# Patient Record
Sex: Male | Born: 1965 | Race: White | Hispanic: No | Marital: Married | Smoking: Never smoker
Health system: Southern US, Community
[De-identification: ages and names within clinical notes are randomized; demographics above are authoritative.]

## PROBLEM LIST (undated history)

## (undated) DIAGNOSIS — I10 Essential (primary) hypertension: Secondary | ICD-10-CM

## (undated) DIAGNOSIS — T4145XA Adverse effect of unspecified anesthetic, initial encounter: Secondary | ICD-10-CM

## (undated) DIAGNOSIS — K2270 Barrett's esophagus without dysplasia: Secondary | ICD-10-CM

## (undated) DIAGNOSIS — Z8719 Personal history of other diseases of the digestive system: Secondary | ICD-10-CM

## (undated) DIAGNOSIS — K8020 Calculus of gallbladder without cholecystitis without obstruction: Secondary | ICD-10-CM

## (undated) DIAGNOSIS — Z87442 Personal history of urinary calculi: Secondary | ICD-10-CM

## (undated) DIAGNOSIS — T8859XA Other complications of anesthesia, initial encounter: Secondary | ICD-10-CM

## (undated) DIAGNOSIS — R001 Bradycardia, unspecified: Secondary | ICD-10-CM

## (undated) DIAGNOSIS — J45909 Unspecified asthma, uncomplicated: Secondary | ICD-10-CM

## (undated) DIAGNOSIS — K3184 Gastroparesis: Secondary | ICD-10-CM

## (undated) DIAGNOSIS — K219 Gastro-esophageal reflux disease without esophagitis: Secondary | ICD-10-CM

## (undated) HISTORY — PX: KNEE ARTHROSCOPY: SUR90

## (undated) HISTORY — PX: INGUINAL HERNIA REPAIR: SHX194

---

## 2003-05-22 HISTORY — PX: COLONOSCOPY WITH ESOPHAGOGASTRODUODENOSCOPY (EGD): SHX5779

## 2004-04-12 ENCOUNTER — Ambulatory Visit: Payer: Self-pay | Admitting: Unknown Physician Specialty

## 2006-12-20 ENCOUNTER — Ambulatory Visit: Payer: Self-pay | Admitting: Unknown Physician Specialty

## 2008-12-27 ENCOUNTER — Ambulatory Visit: Payer: Self-pay | Admitting: Unknown Physician Specialty

## 2011-03-14 ENCOUNTER — Ambulatory Visit: Payer: Self-pay | Admitting: Unknown Physician Specialty

## 2011-12-05 ENCOUNTER — Ambulatory Visit: Payer: Self-pay | Admitting: Nurse Practitioner

## 2011-12-07 ENCOUNTER — Ambulatory Visit: Payer: Self-pay | Admitting: Unknown Physician Specialty

## 2012-02-25 ENCOUNTER — Ambulatory Visit: Payer: Self-pay | Admitting: Urology

## 2012-09-03 ENCOUNTER — Emergency Department: Payer: Self-pay | Admitting: Emergency Medicine

## 2012-09-03 LAB — CBC WITH DIFFERENTIAL/PLATELET
Basophil #: 0 10*3/uL (ref 0.0–0.1)
Basophil %: 0.9 %
Eosinophil %: 5.6 %
HCT: 46.1 % (ref 40.0–52.0)
Lymphocyte %: 33.2 %
MCH: 29.8 pg (ref 26.0–34.0)
MCV: 88 fL (ref 80–100)
Monocyte #: 0.9 x10 3/mm (ref 0.2–1.0)
Monocyte %: 17.8 %
Neutrophil %: 42.5 %
RBC: 5.24 10*6/uL (ref 4.40–5.90)
WBC: 4.9 10*3/uL (ref 3.8–10.6)

## 2012-09-03 LAB — COMPREHENSIVE METABOLIC PANEL
Albumin: 4.1 g/dL (ref 3.4–5.0)
Alkaline Phosphatase: 126 U/L (ref 50–136)
Bilirubin,Total: 0.5 mg/dL (ref 0.2–1.0)
Calcium, Total: 8.9 mg/dL (ref 8.5–10.1)
Chloride: 104 mmol/L (ref 98–107)
Co2: 26 mmol/L (ref 21–32)
EGFR (African American): >60
EGFR (Non-African Amer.): >60
Glucose: 93 mg/dL (ref 65–99)
Osmolality: 268 (ref 275–301)
SGPT (ALT): 45 U/L (ref 12–78)
Sodium: 135 mmol/L — ABNORMAL LOW (ref 136–145)
Total Protein: 8.3 g/dL — ABNORMAL HIGH (ref 6.4–8.2)

## 2014-03-04 DIAGNOSIS — K2270 Barrett's esophagus without dysplasia: Secondary | ICD-10-CM | POA: Insufficient documentation

## 2015-05-22 DIAGNOSIS — K2270 Barrett's esophagus without dysplasia: Secondary | ICD-10-CM

## 2015-05-22 HISTORY — PX: NEUROPLASTY / TRANSPOSITION ULNAR NERVE AT ELBOW: SUR895

## 2015-05-22 HISTORY — DX: Barrett's esophagus without dysplasia: K22.70

## 2017-05-21 DIAGNOSIS — J45909 Unspecified asthma, uncomplicated: Secondary | ICD-10-CM

## 2017-05-21 DIAGNOSIS — K8020 Calculus of gallbladder without cholecystitis without obstruction: Secondary | ICD-10-CM

## 2017-05-21 HISTORY — DX: Unspecified asthma, uncomplicated: J45.909

## 2017-05-21 HISTORY — DX: Calculus of gallbladder without cholecystitis without obstruction: K80.20

## 2017-06-03 ENCOUNTER — Other Ambulatory Visit: Payer: Self-pay

## 2017-06-03 ENCOUNTER — Emergency Department: Payer: BLUE CROSS/BLUE SHIELD

## 2017-06-03 ENCOUNTER — Encounter: Payer: Self-pay | Admitting: Emergency Medicine

## 2017-06-03 ENCOUNTER — Emergency Department
Admission: EM | Admit: 2017-06-03 | Discharge: 2017-06-03 | Disposition: A | Discharge status: Home / Self Care | Payer: BLUE CROSS/BLUE SHIELD | Attending: Emergency Medicine | Admitting: Emergency Medicine

## 2017-06-03 DIAGNOSIS — J45909 Unspecified asthma, uncomplicated: Secondary | ICD-10-CM | POA: Diagnosis not present

## 2017-06-03 DIAGNOSIS — I10 Essential (primary) hypertension: Secondary | ICD-10-CM | POA: Insufficient documentation

## 2017-06-03 DIAGNOSIS — Z79899 Other long term (current) drug therapy: Secondary | ICD-10-CM | POA: Insufficient documentation

## 2017-06-03 DIAGNOSIS — R109 Unspecified abdominal pain: Secondary | ICD-10-CM

## 2017-06-03 DIAGNOSIS — K8020 Calculus of gallbladder without cholecystitis without obstruction: Secondary | ICD-10-CM | POA: Diagnosis not present

## 2017-06-03 DIAGNOSIS — K8021 Calculus of gallbladder without cholecystitis with obstruction: Secondary | ICD-10-CM | POA: Diagnosis not present

## 2017-06-03 DIAGNOSIS — R1032 Left lower quadrant pain: Secondary | ICD-10-CM | POA: Diagnosis present

## 2017-06-03 HISTORY — DX: Gastro-esophageal reflux disease without esophagitis: K21.9

## 2017-06-03 HISTORY — DX: Bradycardia, unspecified: R00.1

## 2017-06-03 HISTORY — DX: Essential (primary) hypertension: I10

## 2017-06-03 HISTORY — DX: Gastroparesis: K31.84

## 2017-06-03 HISTORY — DX: Unspecified asthma, uncomplicated: J45.909

## 2017-06-03 LAB — URINALYSIS, COMPLETE (UACMP) WITH MICROSCOPIC
BACTERIA UA: NONE SEEN
BILIRUBIN URINE: NEGATIVE
Glucose, UA: NEGATIVE mg/dL
Hgb urine dipstick: NEGATIVE
KETONES UR: NEGATIVE mg/dL
LEUKOCYTES UA: NEGATIVE
Nitrite: NEGATIVE
PROTEIN: NEGATIVE mg/dL
SQUAMOUS EPITHELIAL / LPF: NONE SEEN
Specific Gravity, Urine: 1.026 (ref 1.005–1.030)
WBC, UA: NONE SEEN WBC/hpf (ref 0–5)
pH: 6 (ref 5.0–8.0)

## 2017-06-03 LAB — CBC WITH DIFFERENTIAL/PLATELET
BASOS PCT: 1 %
Basophils Absolute: 0.1 10*3/uL (ref 0–0.1)
EOS ABS: 0.1 10*3/uL (ref 0–0.7)
Eosinophils Relative: 1 %
HCT: 42.9 % (ref 40.0–52.0)
HEMOGLOBIN: 14.6 g/dL (ref 13.0–18.0)
Lymphocytes Relative: 15 %
Lymphs Abs: 1.5 10*3/uL (ref 1.0–3.6)
MCH: 29.9 pg (ref 26.0–34.0)
MCHC: 33.9 g/dL (ref 32.0–36.0)
MCV: 88.1 fL (ref 80.0–100.0)
MONOS PCT: 7 %
Monocytes Absolute: 0.7 10*3/uL (ref 0.2–1.0)
NEUTROS PCT: 76 %
Neutro Abs: 7.6 10*3/uL — ABNORMAL HIGH (ref 1.4–6.5)
Platelets: 228 10*3/uL (ref 150–440)
RBC: 4.87 MIL/uL (ref 4.40–5.90)
RDW: 13.6 % (ref 11.5–14.5)
WBC: 10 10*3/uL (ref 3.8–10.6)

## 2017-06-03 LAB — LIPASE, BLOOD: LIPASE: 33 U/L (ref 11–51)

## 2017-06-03 LAB — COMPREHENSIVE METABOLIC PANEL
ALK PHOS: 92 U/L (ref 38–126)
ALT: 45 U/L (ref 17–63)
ANION GAP: 10 (ref 5–15)
AST: 29 U/L (ref 15–41)
Albumin: 4.4 g/dL (ref 3.5–5.0)
BUN: 10 mg/dL (ref 6–20)
CALCIUM: 9.2 mg/dL (ref 8.9–10.3)
CO2: 26 mmol/L (ref 22–32)
Chloride: 103 mmol/L (ref 101–111)
Creatinine, Ser: 1.1 mg/dL (ref 0.61–1.24)
GFR calc Af Amer: >60 mL/min (ref >60)
GFR calc non Af Amer: >60 mL/min (ref >60)
GLUCOSE: 131 mg/dL — AB (ref 65–99)
POTASSIUM: 4 mmol/L (ref 3.5–5.1)
Sodium: 139 mmol/L (ref 135–145)
Total Bilirubin: 1.1 mg/dL (ref 0.3–1.2)
Total Protein: 7.6 g/dL (ref 6.5–8.1)

## 2017-06-03 MED ORDER — ONDANSETRON HCL 4 MG/2ML IJ SOLN: 4.0000 mg | Freq: Once | INTRAMUSCULAR | Status: DC

## 2017-06-03 MED ORDER — HYDROMORPHONE HCL 1 MG/ML IJ SOLN
INTRAMUSCULAR | Status: AC
  Filled 2017-06-03: qty 1

## 2017-06-03 MED ORDER — SODIUM CHLORIDE 0.9 % IV BOLUS (SEPSIS)
1000.0000 mL | Freq: Once | INTRAVENOUS | Status: AC
  Administered 2017-06-03: 1000 mL via INTRAVENOUS

## 2017-06-03 MED ORDER — ONDANSETRON HCL 4 MG/2ML IJ SOLN
INTRAMUSCULAR | Status: AC
  Filled 2017-06-03: qty 2

## 2017-06-03 MED ORDER — HYDROMORPHONE HCL 1 MG/ML IJ SOLN: 1.0000 mg | Freq: Once | INTRAMUSCULAR | Status: DC

## 2017-06-03 MED ORDER — IOPAMIDOL (ISOVUE-370) INJECTION 76%
100.0000 mL | Freq: Once | INTRAVENOUS | Status: AC | PRN
  Administered 2017-06-03: 100 mL via INTRAVENOUS

## 2017-06-03 MED ORDER — ONDANSETRON HCL 4 MG/2ML IJ SOLN
4.0000 mg | Freq: Once | INTRAMUSCULAR | Status: AC
  Administered 2017-06-03: 4 mg via INTRAVENOUS

## 2017-06-03 MED ORDER — HYDROMORPHONE HCL 1 MG/ML IJ SOLN
1.0000 mg | Freq: Once | INTRAMUSCULAR | Status: AC
  Administered 2017-06-03: 1 mg via INTRAVENOUS

## 2017-06-03 NOTE — ED Provider Notes (Signed)
[facility] Emergency Department Provider Note  ____________________________________________  Time seen: Approximately 5:04 PM  I have reviewed the triage vital signs and the nursing notes.   HISTORY  Chief Complaint Abdominal Pain   HPI Cody Walker is a 52 y.o. male with a history of GERD, asthma, and hypertensionwho presents to the hospital for evaluation of abdominal pain. 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 Patient denies ever having similar pain. Patient does not have a history of smoking, or alcohol use. Denies any drug use. Denies constipation or diarrhea, denies hematemesis, denies fever or chills, denies scrotal pain or swelling, hematuria or dysuria. He doesn't history of kidney stones but reports that this pain is nothing like his kidney stones.  Past Medical History:  Diagnosis Date  . Asthma   . Hypertension     There are no active problems to display for this patient.   Prior to Admission medications   Medication Sig Start Date End Date Taking? Authorizing Provider  amLODipine (NORVASC) 2.5 MG tablet Take 1 tablet by mouth daily. 04/12/17   [provider]  esomeprazole (NEXIUM) 40 MG capsule Take 1 capsule by mouth 2 (two) times daily. 05/23/17   [provider]  rosuvastatin (CRESTOR) 10 MG tablet Take 1 tablet by mouth daily. 04/12/17   [provider]    Allergies Patient has no known allergies.  Family history Unknown - patient is adopted  Social History Social History   Tobacco Use  . Smoking status: Never Smoker  . Smokeless tobacco: Current User  Substance Use Topics  . Alcohol use: No    Frequency: Never  . Drug use: No    Review of  Systems  Constitutional: Negative for fever. Eyes: Negative for visual changes. ENT: Negative for sore throat. Neck: No neck pain  Cardiovascular: Negative for chest pain. Respiratory: Negative for shortness of breath. Gastrointestinal: + L sided abdominal pain, nausea, and vomiting. No diarrhea. Genitourinary: Negative for dysuria. Musculoskeletal: Negative for back pain. Skin: Negative for rash. Neurological: Negative for headaches, weakness or numbness. Psych: No SI or HI  ____________________________________________   PHYSICAL EXAM:  VITAL SIGNS: ED Triage Vitals  Enc Vitals Group     BP 06/03/17 1605 137/84     Pulse Rate 06/03/17 1605 (!) 51     Resp 06/03/17 1605 (!) 22     Temp 06/03/17 1605 (!) 96.4 F (35.8 C)     Temp Source 06/03/17 1605 Axillary     SpO2 06/03/17 1605 100 %     Weight 06/03/17 1606 190 lb (86.2 kg)     Height --      Head Circumference --      Peak Flow --      Pain Score 06/03/17 1605 10     Pain Loc --      Pain Edu? --      Excl. in GC? --     Constitutional: Alert and oriented, moderate distress, actively vomiting.  HEENT:      Head: Normocephalic and atraumatic.         Eyes: Conjunctivae are normal. Sclera is non-icteric.       Mouth/Throat: Mucous membranes are moist.       Neck: Supple with no signs of meningismus. Cardiovascular: Regular rate  and rhythm. No murmurs, gallops, or rubs. 2+ symmetrical distal pulses are present in all extremities. No JVD. Respiratory: Normal respiratory effort. Lungs are clear to auscultation bilaterally. No wheezes, crackles, or rhonchi.  Gastrointestinal: Soft, non tender, and non distended with positive bowel sounds. No rebound or guarding. Genitourinary: No CVA tenderness. Bilateral testicles are descended with no tenderness to palpation, bilateral positive cremasteric reflexes are present, no swelling or erythema of the scrotum. No evidence of inguinal hernia. Musculoskeletal: Nontender with  normal range of motion in all extremities. No edema, cyanosis, or erythema of extremities. Neurologic: Normal speech and language. Face is symmetric. Moving all extremities. No gross focal neurologic deficits are appreciated. Skin: Skin is warm, dry and intact. No rash noted. Psychiatric: Mood and affect are normal. Speech and behavior are normal.  ____________________________________________   LABS (all labs ordered are listed, but only abnormal results are displayed)  Labs Reviewed  CBC WITH DIFFERENTIAL/PLATELET - Abnormal; Notable for the following components:      Result Value   Neutro Abs 7.6 (*)    All other components within normal limits  COMPREHENSIVE METABOLIC PANEL - Abnormal; Notable for the following components:   Glucose, Bld 131 (*)    All other components within normal limits  URINALYSIS, COMPLETE (UACMP) WITH MICROSCOPIC - Abnormal; Notable for the following components:   Color, Urine YELLOW (*)    APPearance CLEAR (*)    All other components within normal limits  LIPASE, BLOOD   ____________________________________________  EKG  ED ECG REPORT I, Nita Sickle, the attending physician, personally viewed and interpreted this ECG.  Sinus bradycardia, rate of 51, normal intervals, normal axis, no ST elevations or depressions. No prior for comparison. ____________________________________________  RADIOLOGY  CT a/p:  IMPRESSION: VASCULAR  1. Minimal amount of atherosclerotic plaque within a normal caliber abdominal aorta. Aortic Atherosclerosis (ICD10-I70.0). 2. The major branch vessels of the abdominal aorta appear widely patent. No evidence of mesenteric ischemia.  NON-VASCULAR  1. The gallbladder is distended with 2 stones located within the neck of the gallbladder. This finding is without associated gallbladder wall thickening and suspected minimal amount of adjacent pericholecystic fluid but without definitive gallbladder wall thickening.  Constellation of findings are worrisome for early acute cholecystitis. Clinical correlation is advised. Further evaluation with right upper quadrant abdominal ultrasound and/or nuclear medicine HIDA scan could be performed as indicated. 2. Otherwise, no explanation for patient's acute abdominal pain.   RUQ [place]: Within the gallbladder neck there are 2 gallstones measuring 7.4 and 8.9 mm. Gallbladder sludge. Minimal amount of pericholecystic fluid without gallbladder wall thickening. Per ultrasound technologist patient was not tender over this region during scanning. In the proper clinical setting, early acute cholecystitis cannot be excluded. If further delineation is clinically desired, hepatobiliary study may be considered. ____________________________________________   PROCEDURES  Procedure(s) performed: None Procedures Critical Care performed:  None ____________________________________________   INITIAL IMPRESSION / ASSESSMENT AND PLAN / ED COURSE   52 y.o. male with a history of GERD, asthma, and hypertensionwho presents to the hospital for evaluation of sudden onset of severe sharp LLQ abdominal pain, nausea, and vomiting. Patient looks very uncomfortable, actively vomiting, moaning and groaning, unable to sit still in bed, his abdomen is soft and I am unable to reproduce pain on palpation, there is no CVA tenderness, GU exam is normal with no evidence of torsion or hernia. Bedside ultrasound showing normal caliber aorta and no evidence of hydronephrosis. Ddx dissection versus AAA versus kidney stone versus volvulus versus SBO  versus diverticulitis versus hernia versus colitis vs GB vs pancreatitis    _________________________ 8:56 PM on 06/03/2017 ----------------------------------------- Initial CT concerning for distended gallbladder with 2 stones located in the neck. Right upper quadrant ultrasound showing gallbladder sludge, 2 stones in the neck of the gallbladder with  minimal pericholecystic fluid and no wall thickening. Labs are within normal limits including normal white count, LFTs, T bili, and lipase. Patient's pain is mostly resolved. Dr. [name], surgeon on-call has evaluated patient and recommended outpatient management. Discussed avoiding fatty foods in the meantime. Discussed return precautions with patient. Patient is going be discharged home at this time.    As part of my medical decision making, I reviewed the following data within the electronic MEDICAL RECORD NUMBER Nursing notes reviewed and incorporated, Labs reviewed , EKG interpreted , Radiograph reviewed , A consult was requested and obtained from this/these consultant(s) surgery, Notes from prior ED visits and [place] Controlled Substance Database    Pertinent labs & imaging results that were available during my care of the patient were reviewed by me and considered in my medical decision making (see chart for details).    ____________________________________________   FINAL CLINICAL IMPRESSION(S) / ED DIAGNOSES  Final diagnoses:  Abdominal pain  Symptomatic cholelithiasis      NEW MEDICATIONS STARTED DURING THIS VISIT:  ED Discharge Orders    None       Note:  This document was prepared using Dragon voice recognition software and may include unintentional dictation errors.    Nita SickleVeronese, Leon, MD 06/03/17 [phone]

## 2017-06-03 NOTE — ED Notes (Signed)
Dr. Seth BakeV at bedside with US.

## 2017-06-03 NOTE — ED Triage Notes (Signed)
Pt to ed via ems with abd pain started last night. Ems gave 150mcg fent and 4mg  zofran. Pt unable to lay still on stretcher out in triage. IV est.

## 2017-06-03 NOTE — ED Notes (Signed)
Patient transported to Ultrasound 

## 2017-06-03 NOTE — Consult Note (Signed)
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 Patient reports he's experienced similar post-prandial epigastric pain and pain to the Right of his epigastrium for "many years", but recently he says the pain has gotten worse with nausea and non-bloody non-bilious vomiting after onset of the pain following foods such as pizza, blue cheese salad dressing, and [employer]. He currently says his pain has nearly resolved and adds that he wound not have come to the hospital if he'd felt as well as he feels now. He otherwise denies fever/chills, CP, or SOB.  Surgery is consulted by ED physician Dr. Don Perking in this context for evaluation and management of symptomatic cholelithiasis, possible early cholecystitis.  PAST MEDICAL HISTORY (PMH):  Past Medical History:  Diagnosis Date  . Asthma   . Bradycardia    since childhood per patient  . Gastroparesis   . GERD (gastroesophageal reflux disease)   . Hypertension      PAST SURGICAL HISTORY (PSH):  Past Surgical History:  Procedure Laterality Date  . INGUINAL HERNIA REPAIR       MEDICATIONS:  Prior to Admission medications   Medication Sig Start Date End Date Taking? Authorizing Provider  amLODipine (NORVASC) 2.5 MG tablet Take 1 tablet by mouth daily. 04/12/17   [provider]  esomeprazole (NEXIUM) 40 MG capsule Take 1 capsule by mouth 2 (two) times daily. 05/23/17   [provider]  rosuvastatin (CRESTOR) 10 MG tablet Take 1 tablet by mouth daily. 04/12/17   [provider]     ALLERGIES:  No Known Allergies   SOCIAL HISTORY:  Social History   Socioeconomic History  . Marital status: Married    Spouse name: Not on file  . Number of children: Not on file  . Years of education:  Not on file  . Highest education level: Not on file  Social Needs  . Financial resource strain: Not on file  . Food insecurity - worry: Not on file  . Food insecurity - inability: Not on file  . Transportation needs - medical: Not on file  . Transportation needs - non-medical: Not on file  Occupational History  . Not on file  Tobacco Use  . Smoking status: Never Smoker  . Smokeless tobacco: Current User  Substance and Sexual Activity  . Alcohol use: No    Frequency: Never  . Drug use: No  . Sexual activity: Not on file  Other Topics Concern  . Not on file  Social History Narrative  . Not on file    The patient currently resides (home / rehab facility / nursing home): Home The patient normally is (ambulatory / bedbound): Ambulatory   FAMILY HISTORY:  No family history on file.   REVIEW OF SYSTEMS:  Constitutional: denies weight loss, fever, chills, or sweats  Eyes: denies any other vision changes, history of eye injury  ENT: denies sore throat, hearing problems  Respiratory: denies shortness of breath, wheezing  Cardiovascular: denies chest pain, palpitations  Gastrointestinal: abdominal pain, N/V, and bowel function as per HPI Genitourinary: denies burning with urination or urinary frequency Musculoskeletal: denies any other joint pains or cramps  Skin: denies any other rashes or skin discolorations  Neurological: denies any other headache, dizziness, weakness  Psychiatric: denies any other depression, anxiety   All  other review of systems were negative   VITAL SIGNS:  Temp:  [96.4 F (35.8 C)] 96.4 F (35.8 C) (01/14 1605) Pulse Rate:  [51-76] 76 (01/14 1915) Resp:  [18-22] 19 (01/14 1915) BP: (137-156)/(84-93) 156/93 (01/14 1631) SpO2:  [96 %-100 %] 96 % (01/14 1915) Weight:  [190 lb (86.2 kg)] 190 lb (86.2 kg) (01/14 1606)       Weight: 190 lb (86.2 kg)     INTAKE/OUTPUT:  This shift: No intake/output data recorded.  Last 2 shifts: @IOLAST2SHIFTS @    PHYSICAL EXAM:  Constitutional:  -- Normal body habitus  -- Awake, alert, and oriented x3, no apparent distress Eyes:  -- Pupils equally round and reactive to light  -- No scleral icterus, B/L no occular discharge Ear, nose, throat: -- Neck is FROM WNL -- No jugular venous distension  Pulmonary:  -- No wheezes or rhales -- Equal breath sounds bilaterally -- Breathing non-labored at rest Cardiovascular:  -- S1, S2 present  -- No pericardial rubs  Gastrointestinal:  -- Abdomen soft and non-distended with mild Right of epigastric tenderness to deep palpation, no guarding or rebound tenderness -- No abdominal masses appreciated, pulsatile or otherwise  Musculoskeletal and Integumentary:  -- Wounds or skin discoloration: None appreciated -- Extremities: B/L UE and LE FROM, hands and feet warm, no edema  Neurologic:  -- Motor function: Intact and symmetric -- Sensation: Intact and symmetric Psychiatric:  -- Mood and affect WNL  Labs:  CBC Latest Ref Rng & Units 06/03/2017 09/03/2012  WBC 3.8 - 10.6 K/uL 10.0 4.9  Hemoglobin 13.0 - 18.0 g/dL 16.114.6 09.615.6  Hematocrit 04.540.0 - 52.0 % 42.9 46.1  Platelets 150 - 440 K/uL 228 223   CMP Latest Ref Rng & Units 06/03/2017 09/03/2012  Glucose 65 - 99 mg/dL 409(W131(H) 93  BUN 6 - 20 mg/dL 10 9  Creatinine 1.190.61 - 1.24 mg/dL 1.471.10 8.291.07  Sodium 562135 - 145 mmol/L 139 135(L)  Potassium 3.5 - 5.1 mmol/L 4.0 4.4  Chloride 101 - 111 mmol/L 103 104  CO2 22 - 32 mmol/L 26 26  Calcium 8.9 - 10.3 mg/dL 9.2 8.9  Total Protein 6.5 - 8.1 g/dL 7.6 1.3(Y8.3(H)  Total Bilirubin 0.3 - 1.2 mg/dL 1.1 0.5  Alkaline Phos 38 - 126 U/L 92 126  AST 15 - 41 U/L 29 23  ALT 17 - 63 U/L 45 45   Imaging studies:  Limited RUQ Abdominal Ultrasound (06/03/2017) - personally reviewed and discussed with patient and his family Within gallbladder neck there are 2 gallstones measuring 7.4 and 8.9 mm. Gallbladder sludge. Minimal amount of pericholecystic fluid without gallbladder wall  thickening. Per ultrasound technologist patient was not tender over this region during scanning. Common bile duct diameter measures 4.5 mm.  CTA Abdomen and Pelvis (06/03/2017) 1. The gallbladder is distended with 2 stones located within the neck of the gallbladder. This finding is without associated gallbladder wall thickening and suspected minimal amount of adjacent pericholecystic fluid but without definitive gallbladder wall thickening. Constellation of findings are worrisome for early acute cholecystitis. Clinical correlation is advised. Further evaluation with right upper quadrant abdominal ultrasound and/or nuclear medicine HIDA scan could be performed as indicated. 2. Otherwise, no explanation for patient's acute abdominal pain.  Assessment/Plan: (ICD-10's: 18K80.21) 52 y.o. male with symptomatic cholelithiasis, complicated by pertinent comorbidities including HTN, chronic asymptomatic bradycardia, asthma, GERD, and gastroparesis.   - avoid/minimize fatty foods (meats, cheeses/dairy, fried)  - prefer low-/non-fat vegetables, whole grains, and fruits to minimize symptoms  -  if needs pain medication, prefer NSAIDs, Tylenol, or tramadol/fentanyl over morphine derivatives  - patient instructed and says he plans to call office to schedule outpatient follow-up  - anticipate elective laparoscopic cholecystectomy, date to be determined  - medical management of comorbidities (home medications)  All of the above findings and recommendations were discussed with the patient and his family, and all of patient's and his family's questions were answered to their expressed satisfaction.  Thank you for the opportunity to participate in this patient's care.   -- [name] Earlene Plater, MD, RPVI [place]: [facility] - Partnering for exceptional care. Office: [phone]

## 2017-06-06 ENCOUNTER — Other Ambulatory Visit: Payer: Self-pay

## 2017-06-06 DIAGNOSIS — K219 Gastro-esophageal reflux disease without esophagitis: Secondary | ICD-10-CM | POA: Insufficient documentation

## 2017-06-06 DIAGNOSIS — E785 Hyperlipidemia, unspecified: Secondary | ICD-10-CM | POA: Insufficient documentation

## 2017-06-06 DIAGNOSIS — M545 Low back pain: Secondary | ICD-10-CM

## 2017-06-06 DIAGNOSIS — N200 Calculus of kidney: Secondary | ICD-10-CM | POA: Insufficient documentation

## 2017-06-06 DIAGNOSIS — G8929 Other chronic pain: Secondary | ICD-10-CM | POA: Insufficient documentation

## 2017-06-06 DIAGNOSIS — M1990 Unspecified osteoarthritis, unspecified site: Secondary | ICD-10-CM | POA: Insufficient documentation

## 2017-06-07 ENCOUNTER — Ambulatory Visit (INDEPENDENT_AMBULATORY_CARE_PROVIDER_SITE_OTHER): Payer: BLUE CROSS/BLUE SHIELD | Admitting: Surgery

## 2017-06-07 ENCOUNTER — Encounter: Payer: Self-pay | Admitting: Surgery

## 2017-06-07 VITALS — BP 118/86 | HR 98 | Temp 97.7°F | Wt 198.0 lb

## 2017-06-07 DIAGNOSIS — K8021 Calculus of gallbladder without cholecystitis with obstruction: Secondary | ICD-10-CM | POA: Diagnosis not present

## 2017-06-07 NOTE — Progress Notes (Signed)
Surgical Clinic Progress/Follow-up Note   HPI:  52 y.o. Male presents to clinic for follow-up evaluation of epigastric abdominal pain after recently seen in ED for same. Patient reports his epigastric pain has improved noticeably with dietary changes to minimize fatty foods, but he continues to feel bloated with variable LLQ and central wave-like crampy abdominal pain associated with chronic constipation despite long-time once daily Colace. Patient has also been prescribed by Dr. Mechele Collin (GI) and continues to take his Nexium twice daily over the past 10 years with EGD every other year, due again next year. 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  Review of Systems:  Constitutional: denies any other weight loss, fever, chills, or sweats  Eyes: denies any other vision changes, history of eye injury  ENT: denies sore throat, hearing problems  Respiratory: denies shortness of breath, wheezing  Cardiovascular: denies chest pain, palpitations  Gastrointestinal: abdominal pain, N/V, and bowel function as per HPI Musculoskeletal: denies any other joint pains or cramps  Skin: Denies any other rashes or skin discolorations  Neurological: denies any other headache, dizziness, weakness  Psychiatric: denies any other depression, anxiety  All other review of systems: otherwise negative   Vital Signs:  BP 118/86   Pulse 98   Temp 97.7 F (36.5 C) (Oral)   Wt 198 lb (89.8 kg)    Physical Exam:  Constitutional:  -- Normal body habitus  -- Awake, alert, and oriented x3  Eyes:  -- Pupils equally round and reactive to light  -- No scleral icterus  Ear, nose, throat:  -- No jugular venous distension  -- No nasal drainage, bleeding Pulmonary:  -- No crackles -- Equal breath sounds bilaterally -- Breathing non-labored at rest Cardiovascular:  -- S1, S2 present   -- No pericardial rubs  Gastrointestinal:  -- Soft and minimal abdominal distention with mild LLQ and RUQ tenderness to palpation, no guarding/rebound  -- No abdominal masses appreciated, pulsatile or otherwise  Musculoskeletal / Integumentary:  -- Wounds or skin discoloration: None appreciated  -- Extremities: B/L UE and LE FROM, hands and feet warm, no edema  Neurologic:  -- Motor function: intact and symmetric  -- Sensation: intact and symmetric   Laboratory studies:  CBC Latest Ref Rng & Units 06/03/2017 09/03/2012  WBC 3.8 - 10.6 K/uL 10.0 4.9  Hemoglobin 13.0 - 18.0 g/dL 60.4 54.0  Hematocrit 98.1 - 52.0 % 42.9 46.1  Platelets 150 - 440 K/uL 228 223   CMP Latest Ref Rng & Units 06/03/2017 09/03/2012  Glucose 65 - 99 mg/dL 191(Y) 93  BUN 6 - 20 mg/dL 10 9  Creatinine 7.82 - 1.24 mg/dL 9.56 2.13  Sodium 086 - 145 mmol/L 139 135(L)  Potassium 3.5 - 5.1 mmol/L 4.0 4.4  Chloride 101 - 111 mmol/L 103 104  CO2 22 - 32 mmol/L 26 26  Calcium 8.9 - 10.3 mg/dL 9.2 8.9  Total Protein 6.5 - 8.1 g/dL 7.6 5.7(Q)  Total Bilirubin 0.3 - 1.2 mg/dL 1.1 0.5  Alkaline Phos 38 - 126 U/L 92 126  AST 15 - 41 U/L 29 23  ALT 17 - 63 U/L 45 45   Imaging:  Limited RUQ Abdominal Ultrasound (06/03/2017) - personally reviewed and discussed with patient and his family Within gallbladder neck there are 2 gallstones measuring 7.4 and 8.9 mm. Gallbladder sludge. Minimal amount of pericholecystic fluid without gallbladder wall thickening. Per  ultrasound technologist patient was not tender over this region during scanning. Common bile duct diameter measures 4.5 mm.  CTA Abdomen and Pelvis (06/03/2017) 1. The gallbladder is distended with 2 stones located within the neck of the gallbladder. This finding is without associated gallbladder wall thickening and suspected minimal amount of adjacent pericholecystic fluid but without definitive gallbladder wall thickening. Constellation of findings are worrisome  for early acute cholecystitis. Clinical correlation is advised. Further evaluation with right upper quadrant abdominal ultrasound and/or nuclear medicine HIDA scan could be performed as indicated. 2. Otherwise, no explanation for patient's acute abdominal pain.  Assessment:  52 y.o. yo Male with a problem list including...  Patient Active Problem List   Diagnosis Date Noted  . Gallbladder calculus without cholecystitis and with obstruction   . Arthritis 06/06/2017  . Chronic low back pain 06/06/2017  . Gastric reflux 06/06/2017  . Hyperlipemia 06/06/2017  . Kidney stones 06/06/2017  . Barrett esophagus 03/04/2014    presents to clinic for symptomatic cholelithiasis and to schedule outpatient elective laparoscopic cholecystectomy for symptomatic cholelithiasis, complicated by comorbidities including HTN, chronic asymptomatic bradycardia, asthma, GERD, and gastroparesis.  Plan:   - minimize/avoid fatty foods (meats, cheeses/dairy, fried foods), prefer vegetables, whole grains, fruits  - all risks, benefits, and alternatives to cholecystectomy were discussed with the patient, all of his questions were answered to his expressed satisfaction, patient expresses he wishes to proceed, and informed consent was obtained.  - will plan for laparoscopic cholecystectomy Monday, 1/28 per patient's request after 1/22 so he can help his son prepare for EMS test without post-operative lifting/activity restrictions  - continue to keep hydrated and high-fiber foods, add Miralax +/- magnesium citrate to bowel regimen until BM's normalize and consider increasing Colace to BID, may reduce if BM's become loose  - anticipate return to clinic 2 weeks after surgery, instructed to call office if any questions or concerns  All of the above recommendations were discussed with the patient, and all of patient's questions were answered to his expressed satisfaction.  -- Scherrie GerlachJason E. [name], MD, RPVI [place]: [facility] - Partnering for exceptional care. Office: [phone]

## 2017-06-07 NOTE — H&P (View-Only) (Signed)
Surgical Clinic Progress/Follow-up Note   HPI:  52 y.o. Male presents to clinic for follow-up evaluation of epigastric abdominal pain after recently seen in ED for same. Patient reports his epigastric pain has improved noticeably with dietary changes to minimize fatty foods, but he continues to feel bloated with variable LLQ and central wave-like crampy abdominal pain associated with chronic constipation despite long-time once daily Colace. Patient has also been prescribed by Dr. Mechele Collin (GI) and continues to take his Nexium twice daily over the past 10 years with EGD every other year, due again next year. 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  Review of Systems:  Constitutional: denies any other weight loss, fever, chills, or sweats  Eyes: denies any other vision changes, history of eye injury  ENT: denies sore throat, hearing problems  Respiratory: denies shortness of breath, wheezing  Cardiovascular: denies chest pain, palpitations  Gastrointestinal: abdominal pain, N/V, and bowel function as per HPI Musculoskeletal: denies any other joint pains or cramps  Skin: Denies any other rashes or skin discolorations  Neurological: denies any other headache, dizziness, weakness  Psychiatric: denies any other depression, anxiety  All other review of systems: otherwise negative   Vital Signs:  BP 118/86   Pulse 98   Temp 97.7 F (36.5 C) (Oral)   Wt 198 lb (89.8 kg)    Physical Exam:  Constitutional:  -- Normal body habitus  -- Awake, alert, and oriented x3  Eyes:  -- Pupils equally round and reactive to light  -- No scleral icterus  Ear, nose, throat:  -- No jugular venous distension  -- No nasal drainage, bleeding Pulmonary:  -- No crackles -- Equal breath sounds bilaterally -- Breathing non-labored at rest Cardiovascular:  -- S1, S2 present   -- No pericardial rubs  Gastrointestinal:  -- Soft and minimal abdominal distention with mild LLQ and RUQ tenderness to palpation, no guarding/rebound  -- No abdominal masses appreciated, pulsatile or otherwise  Musculoskeletal / Integumentary:  -- Wounds or skin discoloration: None appreciated  -- Extremities: B/L UE and LE FROM, hands and feet warm, no edema  Neurologic:  -- Motor function: intact and symmetric  -- Sensation: intact and symmetric   Laboratory studies:  CBC Latest Ref Rng & Units 06/03/2017 09/03/2012  WBC 3.8 - 10.6 K/uL 10.0 4.9  Hemoglobin 13.0 - 18.0 g/dL 60.4 54.0  Hematocrit 98.1 - 52.0 % 42.9 46.1  Platelets 150 - 440 K/uL 228 223   CMP Latest Ref Rng & Units 06/03/2017 09/03/2012  Glucose 65 - 99 mg/dL 191(Y) 93  BUN 6 - 20 mg/dL 10 9  Creatinine 7.82 - 1.24 mg/dL 9.56 2.13  Sodium 086 - 145 mmol/L 139 135(L)  Potassium 3.5 - 5.1 mmol/L 4.0 4.4  Chloride 101 - 111 mmol/L 103 104  CO2 22 - 32 mmol/L 26 26  Calcium 8.9 - 10.3 mg/dL 9.2 8.9  Total Protein 6.5 - 8.1 g/dL 7.6 5.7(Q)  Total Bilirubin 0.3 - 1.2 mg/dL 1.1 0.5  Alkaline Phos 38 - 126 U/L 92 126  AST 15 - 41 U/L 29 23  ALT 17 - 63 U/L 45 45   Imaging:  Limited RUQ Abdominal Ultrasound (06/03/2017) - personally reviewed and discussed with patient and his family Within gallbladder neck there are 2 gallstones measuring 7.4 and 8.9 mm. Gallbladder sludge. Minimal amount of pericholecystic fluid without gallbladder wall thickening. Per  ultrasound technologist patient was not tender over this region during scanning. Common bile duct diameter measures 4.5 mm.  CTA Abdomen and Pelvis (06/03/2017) 1. The gallbladder is distended with 2 stones located within the neck of the gallbladder. This finding is without associated gallbladder wall thickening and suspected minimal amount of adjacent pericholecystic fluid but without definitive gallbladder wall thickening. Constellation of findings are worrisome  for early acute cholecystitis. Clinical correlation is advised. Further evaluation with right upper quadrant abdominal ultrasound and/or nuclear medicine HIDA scan could be performed as indicated. 2. Otherwise, no explanation for patient's acute abdominal pain.  Assessment:  52 y.o. yo Male with a problem list including...  Patient Active Problem List   Diagnosis Date Noted  . Gallbladder calculus without cholecystitis and with obstruction   . Arthritis 06/06/2017  . Chronic low back pain 06/06/2017  . Gastric reflux 06/06/2017  . Hyperlipemia 06/06/2017  . Kidney stones 06/06/2017  . Barrett esophagus 03/04/2014    presents to clinic for symptomatic cholelithiasis and to schedule outpatient elective laparoscopic cholecystectomy for symptomatic cholelithiasis, complicated by comorbidities including HTN, chronic asymptomatic bradycardia, asthma, GERD, and gastroparesis.  Plan:   - minimize/avoid fatty foods (meats, cheeses/dairy, fried foods), prefer vegetables, whole grains, fruits  - all risks, benefits, and alternatives to cholecystectomy were discussed with the patient, all of his questions were answered to his expressed satisfaction, patient expresses he wishes to proceed, and informed consent was obtained.  - will plan for laparoscopic cholecystectomy Monday, 1/28 per patient's request after 1/22 so he can help his son prepare for EMS test without post-operative lifting/activity restrictions  - continue to keep hydrated and high-fiber foods, add Miralax +/- magnesium citrate to bowel regimen until BM's normalize and consider increasing Colace to BID, may reduce if BM's become loose  - anticipate return to clinic 2 weeks after surgery, instructed to call office if any questions or concerns  All of the above recommendations were discussed with the patient, and all of patient's questions were answered to his expressed satisfaction.  -- Scherrie GerlachJason E. [name], MD, RPVI [place]: [facility] - Partnering for exceptional care. Office: [phone]

## 2017-06-07 NOTE — Addendum Note (Signed)
Addended by: Chrisandra NettersAVIS, [name] on: 06/07/2017 10:51 AM   Modules accepted: Orders, SmartSet

## 2017-06-07 NOTE — Patient Instructions (Addendum)
You have requested to have your gallbladder removed. This will be done on 06/17/17 at [facility] with Dr. [name].  You will most likely be out of work 1-2 weeks for this surgery. You will return after your post-op appointment with a lifting restriction for approximately 4 more weeks.  You will be able to eat anything you would like to following surgery. But, start by eating a bland diet and advance this as tolerated. The Gallbladder diet is below, please go as closely by this diet as possible prior to surgery to avoid any further attacks.  Please see the (blue)pre-care form that you have been given today. If you have any questions, please call our office.  Laparoscopic Cholecystectomy Laparoscopic cholecystectomy is surgery to remove the gallbladder. The gallbladder is located in the upper right part of the abdomen, behind the liver. It is a storage sac for bile, which is produced in the liver. Bile aids in the digestion and absorption of fats. Cholecystectomy is often done for inflammation of the gallbladder (cholecystitis). This condition is usually caused by a buildup of gallstones (cholelithiasis) in the gallbladder. Gallstones can block the flow of bile, and that can result in inflammation and pain. In severe cases, emergency surgery may be required. If emergency surgery is not required, you will have time to prepare for the procedure. Laparoscopic surgery is an alternative to open surgery. Laparoscopic surgery has a shorter recovery time. Your common bile duct may also need to be examined during the procedure. If stones are found in the common bile duct, they may be removed. LET [facility]ARE PROVIDER KNOW ABOUT:  Any allergies you have.  All medicines you are taking, including vitamins, herbs, eye drops, creams, and over-the-counter medicines.  Previous problems you or members of your family have had with the use of anesthetics.  Any blood disorders you have.  Previous surgeries  you have had.    Any medical conditions you have. RISKS AND COMPLICATIONS Generally, this is a safe procedure. However, problems may occur, including:  Infection.  Bleeding.  Allergic reactions to medicines.  Damage to other structures or organs.  A stone remaining in the common bile duct.  A bile leak from the cyst duct that is clipped when your gallbladder is removed.  The need to convert to open surgery, which requires a larger incision in the abdomen. This may be necessary if your surgeon thinks that it is not safe to continue with a laparoscopic procedure. BEFORE THE PROCEDURE  Ask your health care provider about:  Changing or stopping your regular medicines. This is especially important if you are taking diabetes medicines or blood thinners.  Taking medicines such as aspirin and ibuprofen. These medicines can thin your blood. Do not take these medicines before your procedure if your health care provider instructs you not to.  Follow instructions from your health care provider about eating or drinking restrictions.  Let your health care provider know if you develop a cold or an infection before surgery.  Plan to have someone take you home after the procedure.  Ask your health care provider how your surgical site will be marked or identified.  You may be given antibiotic medicine to help prevent infection. PROCEDURE  To reduce your risk of infection:  Your health care team will wash or sanitize their hands.  Your skin will be washed with soap.  An IV tube may be inserted into one of your veins.  You will be given a medicine to make  you fall asleep (general anesthetic).  A breathing tube will be placed in your mouth.  The surgeon will make several small cuts (incisions) in your abdomen.  A thin, lighted tube (laparoscope) that has a tiny camera on the end will be inserted through one of the small incisions. The camera on the laparoscope will send a picture to  a TV screen (monitor) in the operating room. This will give the surgeon a good view inside your abdomen.  A gas will be pumped into your abdomen. This will expand your abdomen to give the surgeon more room to perform the surgery.  Other tools that are needed for the procedure will be inserted through the other incisions. The gallbladder will be removed through one of the incisions.  After your gallbladder has been removed, the incisions will be closed with stitches (sutures), staples, or skin glue.  Your incisions may be covered with a bandage (dressing). The procedure may vary among health care providers and hospitals. AFTER THE PROCEDURE  Your blood pressure, heart rate, breathing rate, and blood oxygen level will be monitored often until the medicines you were given have worn off.  You will be given medicines as needed to control your pain.   This information is not intended to replace advice given to you by your health care provider. Make sure you discuss any questions you have with your health care provider.   Document Released: 05/07/2005 Document Revised: 01/26/2015 Document Reviewed: 12/17/2012 Elsevier Interactive Patient Education 2016 [place] Diet for Gallbladder Conditions A low-fat diet can be helpful if you have pancreatitis or a gallbladder condition. With these conditions, your pancreas and gallbladder have trouble digesting fats. A healthy eating plan with less fat will help rest your pancreas and gallbladder and reduce your symptoms. WHAT DO I NEED TO KNOW ABOUT THIS DIET?  Eat a low-fat diet.  Reduce your fat intake to less than 20-30% of your total daily calories. This is less than 50-60 g of fat per day.  Remember that you need some fat in your diet. Ask your dietician what your daily goal should be.  Choose nonfat and low-fat healthy foods. Look for the words "nonfat," "low fat," or "fat free."  As a guide, look on the label and choose foods  with less than 3 g of fat per serving. Eat only one serving.  Avoid alcohol.  Do not smoke. If you need help quitting, talk with your health care provider.  Eat small frequent meals instead of three large heavy meals. WHAT FOODS CAN I EAT? Grains Include healthy grains and starches such as potatoes, wheat bread, fiber-rich cereal, and brown rice. Choose whole grain options whenever possible. In adults, whole grains should account for 45-65% of your daily calories.  Fruits and Vegetables Eat plenty of fruits and vegetables. Fresh fruits and vegetables add fiber to your diet. Meats and Other Protein Sources Eat lean meat such as chicken and pork. Trim any fat off of meat before cooking it. Eggs, fish, and beans are other sources of protein. In adults, these foods should account for 10-35% of your daily calories. Dairy Choose low-fat milk and dairy options. Dairy includes fat and protein, as well as calcium.  Fats and Oils Limit high-fat foods such as fried foods, sweets, baked goods, sugary drinks.  Other Creamy sauces and condiments, such as mayonnaise, can add extra fat. Think about whether or not you need to use them, or use smaller amounts or low fat options.  WHAT FOODS ARE NOT RECOMMENDED?  High fat foods, such as:  [employer].  Ice cream.  [place] toast.  Sweet rolls.  Pizza.  Cheese bread.  Foods covered with batter, butter, creamy sauces, or cheese.  Fried foods.  Sugary drinks and desserts.  Foods that cause gas or bloating   This information is not intended to replace advice given to you by your health care provider. Make sure you discuss any questions you have with your health care provider.   Document Released: 05/12/2013 Document Reviewed: 05/12/2013 Elsevier Interactive Patient Education [employer].

## 2017-06-11 ENCOUNTER — Other Ambulatory Visit: Payer: Self-pay

## 2017-06-11 ENCOUNTER — Telehealth: Payer: Self-pay | Admitting: Surgery

## 2017-06-11 ENCOUNTER — Encounter
Admission: RE | Admit: 2017-06-11 | Discharge: 2017-06-11 | Disposition: A | Discharge status: Home / Self Care | Payer: BLUE CROSS/BLUE SHIELD | Source: Ambulatory Visit | Attending: Surgery | Admitting: Surgery

## 2017-06-11 HISTORY — DX: Barrett's esophagus without dysplasia: K22.70

## 2017-06-11 HISTORY — DX: Adverse effect of unspecified anesthetic, initial encounter: T41.45XA

## 2017-06-11 HISTORY — DX: Personal history of other diseases of the digestive system: Z87.19

## 2017-06-11 HISTORY — DX: Other complications of anesthesia, initial encounter: T88.59XA

## 2017-06-11 HISTORY — DX: Calculus of gallbladder without cholecystitis without obstruction: K80.20

## 2017-06-11 HISTORY — DX: Personal history of urinary calculi: Z87.442

## 2017-06-11 NOTE — Telephone Encounter (Signed)
I have called patient to to go over surgery information. No answer on all numbers listed. I have left a message on all voicemail's.  Sx: 06/17/17 with Dr Davis-laparoscopic cholecystectomy with gram.  Pre op: 06/11/17 between 1-5:00pm--phone interview.   Patient made aware to call [phone], between 1-3:00pm the day before surgery, to find out what time to arrive.

## 2017-06-11 NOTE — Patient Instructions (Signed)
Your procedure is scheduled on: Monday , June 17, 2017  Report to THE [facility]ICAL MALL, 2ND FLOOR  To find out your arrival time please call [phone] between 1PM - 3PM on Friday, January 25,2019  Remember: Instructions that are not followed completely may result in serious medical risk, up to and including death, or upon the discretion of your surgeon and anesthesiologist your surgery may need to be rescheduled.     _X__ 1. Do not eat food after midnight the night before your procedure.                 No gum chewing or hard candies. You may drink clear liquids up to 2 hours                 before you are scheduled to arrive for your surgery- DO not drink clear                 liquids within 2 hours of the start of your surgery.                 Clear Liquids include:  water, apple juice without pulp, clear carbohydrate                 drink such as Clearfast of Gartorade, Black Coffee or Tea (Do not add                 anything to coffee or tea).     _X__ 2.  No Alcohol for 24 hours before or after surgery.   _X__ 3.  Do Not Smoke or use e-cigarettes For 24 Hours Prior to Your Surgery.                 Do not use any chewable tobacco products for at least 6 hours prior to                 surgery.  ____  4.  Bring all medications with you on the day of surgery if instructed.   _X___  5.  Notify your doctor if there is any change in your medical condition      (cold, fever, infections).     Do not wear jewelry, make-up, hairpins, clips or nail polish. Do not wear lotions, powders, or perfumes. You may wear deodorant. Do not shave 48 hours prior to surgery. Men may shave face and neck. Do not bring valuables to the hospital.    [facility] is not responsible for any belongings or valuables.  Contacts, dentures or bridgework may not be worn into surgery. Leave your suitcase in the car. After surgery it may be brought to your room. For patients admitted to  the hospital, discharge time is determined by your treatment team.   Patients discharged the day of surgery will not be allowed to drive home.   Please read over the following fact sheets that you were given:   INSTRUCTIONS PROVIDED OVER THE PHONE   ____ Take these medicines the morning of surgery with A SIP OF WATER:    1.  AMLODIPINE  2.  NEXIUM (TAKE AS USUAL THE NIGHT BEFORE SURGERY AND THE MORNING OF SURGERY)  3.   4.  5.  6.  ____ Fleet Enema (as directed)   __X__ Use ANTIBACTERIAL SOAP DAY OF SURGERY  ____ Use inhalers on the day of surgery  __X__ Stop ALL ASPIRIN PRODUCTS AND ANTI-INFLAMMATORIES AS OF TODAY, June 11, 2017.  THIS INCLUDES IBUPROFEN / MOTRIN / ADVIL / ALEVE / NAPROSYN / GOODIES POWDERS / BC POWDER.                        YOU MAY TAKE TYLENOL AS NEEDED   ____ Stop supplements until after surgery.    ____ Bring C-Pap to the hospital.   CONTINUE TO TAKE DULCOLAX AND CRESTOR AS USUAL, JUST DO NOT TAKE ON THE MORNING OF SURGERY.  WEAR LOOSE FITTING CLOTHING.  HAVE A FIRM PILLOW READY FOR USE FOR AFTER SURGERY.  NO DIP FOR 24 HOURS BEFORE AND AFTER SURGERY

## 2017-06-11 NOTE — Telephone Encounter (Signed)
Patients wife called back information given to spouse.

## 2017-06-16 MED ORDER — CEFAZOLIN SODIUM-DEXTROSE 2-4 GM/100ML-% IV SOLN
2.0000 g | INTRAVENOUS | Status: AC
  Administered 2017-06-17: 2 g via INTRAVENOUS

## 2017-06-17 ENCOUNTER — Ambulatory Visit: Payer: BLUE CROSS/BLUE SHIELD | Admitting: Anesthesiology

## 2017-06-17 ENCOUNTER — Encounter
Admission: RE | Disposition: A | Discharge status: Home / Self Care | Payer: Self-pay | Source: Ambulatory Visit | Attending: Surgery

## 2017-06-17 ENCOUNTER — Ambulatory Visit
Admission: RE | Admit: 2017-06-17 | Discharge: 2017-06-17 | Disposition: A | Discharge status: Home / Self Care | Payer: BLUE CROSS/BLUE SHIELD | Source: Ambulatory Visit | Attending: Surgery | Admitting: Surgery

## 2017-06-17 DIAGNOSIS — K5909 Other constipation: Secondary | ICD-10-CM | POA: Insufficient documentation

## 2017-06-17 DIAGNOSIS — K219 Gastro-esophageal reflux disease without esophagitis: Secondary | ICD-10-CM | POA: Diagnosis not present

## 2017-06-17 DIAGNOSIS — I10 Essential (primary) hypertension: Secondary | ICD-10-CM | POA: Insufficient documentation

## 2017-06-17 DIAGNOSIS — E785 Hyperlipidemia, unspecified: Secondary | ICD-10-CM | POA: Insufficient documentation

## 2017-06-17 DIAGNOSIS — Z79899 Other long term (current) drug therapy: Secondary | ICD-10-CM | POA: Diagnosis not present

## 2017-06-17 DIAGNOSIS — J45909 Unspecified asthma, uncomplicated: Secondary | ICD-10-CM | POA: Diagnosis not present

## 2017-06-17 DIAGNOSIS — K8020 Calculus of gallbladder without cholecystitis without obstruction: Secondary | ICD-10-CM

## 2017-06-17 DIAGNOSIS — K8010 Calculus of gallbladder with chronic cholecystitis without obstruction: Secondary | ICD-10-CM | POA: Insufficient documentation

## 2017-06-17 HISTORY — PX: CHOLECYSTECTOMY: SHX55

## 2017-06-17 LAB — GLUCOSE, CAPILLARY: GLUCOSE-CAPILLARY: 97 mg/dL (ref 65–99)

## 2017-06-17 SURGERY — LAPAROSCOPIC CHOLECYSTECTOMY WITH INTRAOPERATIVE CHOLANGIOGRAM
Anesthesia: General | Wound class: Clean Contaminated

## 2017-06-17 MED ORDER — LIDOCAINE HCL (PF) 1 % IJ SOLN
INTRAMUSCULAR | Status: AC
  Filled 2017-06-17: qty 30

## 2017-06-17 MED ORDER — FENTANYL CITRATE (PF) 100 MCG/2ML IJ SOLN
INTRAMUSCULAR | Status: AC
  Filled 2017-06-17: qty 2

## 2017-06-17 MED ORDER — PROPOFOL 10 MG/ML IV BOLUS
INTRAVENOUS | Status: DC | PRN
  Administered 2017-06-17: 160 mg via INTRAVENOUS

## 2017-06-17 MED ORDER — SUGAMMADEX SODIUM 200 MG/2ML IV SOLN
INTRAVENOUS | Status: DC | PRN
  Administered 2017-06-17: 200 mg via INTRAVENOUS

## 2017-06-17 MED ORDER — LIDOCAINE HCL 1 % IJ SOLN
INTRAMUSCULAR | Status: DC | PRN
  Administered 2017-06-17: 40 mL

## 2017-06-17 MED ORDER — ROCURONIUM BROMIDE 100 MG/10ML IV SOLN
INTRAVENOUS | Status: DC | PRN
  Administered 2017-06-17: 10 mg via INTRAVENOUS
  Administered 2017-06-17: 40 mg via INTRAVENOUS

## 2017-06-17 MED ORDER — OXYCODONE HCL 5 MG/5ML PO SOLN: 5.0000 mg | Freq: Once | ORAL | Status: AC | PRN

## 2017-06-17 MED ORDER — MIDAZOLAM HCL 2 MG/2ML IJ SOLN
INTRAMUSCULAR | Status: DC | PRN
  Administered 2017-06-17: 2 mg via INTRAVENOUS

## 2017-06-17 MED ORDER — DIPHENHYDRAMINE HCL 50 MG/ML IJ SOLN
INTRAMUSCULAR | Status: DC | PRN
  Administered 2017-06-17: 50 mg via INTRAVENOUS

## 2017-06-17 MED ORDER — ONDANSETRON HCL 4 MG/2ML IJ SOLN
INTRAMUSCULAR | Status: DC | PRN
  Administered 2017-06-17: 4 mg via INTRAVENOUS

## 2017-06-17 MED ORDER — DEXMEDETOMIDINE HCL IN NACL 80 MCG/20ML IV SOLN
INTRAVENOUS | Status: AC
  Filled 2017-06-17: qty 20

## 2017-06-17 MED ORDER — PROPOFOL 10 MG/ML IV BOLUS
INTRAVENOUS | Status: AC
  Filled 2017-06-17: qty 20

## 2017-06-17 MED ORDER — SEVOFLURANE IN SOLN
RESPIRATORY_TRACT | Status: AC
  Filled 2017-06-17: qty 250

## 2017-06-17 MED ORDER — SODIUM CHLORIDE 0.9 % IV SOLN
INTRAVENOUS | Status: DC
  Administered 2017-06-17: 08:00:00 via INTRAVENOUS

## 2017-06-17 MED ORDER — MIDAZOLAM HCL 2 MG/2ML IJ SOLN
INTRAMUSCULAR | Status: AC
  Filled 2017-06-17: qty 2

## 2017-06-17 MED ORDER — CHLORHEXIDINE GLUCONATE CLOTH 2 % EX PADS: 6.0000 | MEDICATED_PAD | Freq: Once | CUTANEOUS | Status: DC

## 2017-06-17 MED ORDER — FENTANYL CITRATE (PF) 100 MCG/2ML IJ SOLN
25.0000 ug | INTRAMUSCULAR | Status: DC | PRN
  Administered 2017-06-17 (×3): 50 ug via INTRAVENOUS

## 2017-06-17 MED ORDER — OXYCODONE HCL 5 MG PO TABS
ORAL_TABLET | ORAL | Status: AC
  Administered 2017-06-17: 5 mg via ORAL
  Filled 2017-06-17: qty 1

## 2017-06-17 MED ORDER — PROMETHAZINE HCL 25 MG/ML IJ SOLN
INTRAMUSCULAR | Status: AC
  Filled 2017-06-17: qty 1

## 2017-06-17 MED ORDER — SODIUM CHLORIDE FLUSH 0.9 % IV SOLN
INTRAVENOUS | Status: AC
  Filled 2017-06-17: qty 10

## 2017-06-17 MED ORDER — DEXAMETHASONE SODIUM PHOSPHATE 10 MG/ML IJ SOLN
INTRAMUSCULAR | Status: DC | PRN
  Administered 2017-06-17: 6 mg via INTRAVENOUS

## 2017-06-17 MED ORDER — CEFAZOLIN SODIUM-DEXTROSE 2-4 GM/100ML-% IV SOLN
INTRAVENOUS | Status: AC
  Filled 2017-06-17: qty 100

## 2017-06-17 MED ORDER — OXYCODONE HCL 5 MG PO TABS
5.0000 mg | ORAL_TABLET | Freq: Once | ORAL | Status: AC | PRN
  Administered 2017-06-17: 5 mg via ORAL

## 2017-06-17 MED ORDER — OXYCODONE-ACETAMINOPHEN 5-325 MG PO TABS: 1.0000 | ORAL_TABLET | ORAL | 0 refills | Status: DC | PRN

## 2017-06-17 MED ORDER — PROMETHAZINE HCL 25 MG/ML IJ SOLN
6.2500 mg | INTRAMUSCULAR | Status: DC | PRN
  Administered 2017-06-17: 6.25 mg via INTRAVENOUS

## 2017-06-17 MED ORDER — LIDOCAINE HCL (CARDIAC) 20 MG/ML IV SOLN
INTRAVENOUS | Status: DC | PRN
  Administered 2017-06-17: 80 mg via INTRAVENOUS

## 2017-06-17 MED ORDER — FENTANYL CITRATE (PF) 100 MCG/2ML IJ SOLN
INTRAMUSCULAR | Status: DC | PRN
  Administered 2017-06-17 (×2): 50 ug via INTRAVENOUS

## 2017-06-17 MED ORDER — BUPIVACAINE HCL (PF) 0.5 % IJ SOLN
INTRAMUSCULAR | Status: AC
  Filled 2017-06-17: qty 30

## 2017-06-17 SURGICAL SUPPLY — 36 items
APPLIER CLIP ROT 10 11.4 M/L (STAPLE) ×3
CATH REDDICK CHOLANGI 4FR 50CM (CATHETERS) IMPLANT
CHLORAPREP W/TINT 26ML (MISCELLANEOUS) ×3 IMPLANT
CLIP APPLIE ROT 10 11.4 M/L (STAPLE) ×1 IMPLANT
DECANTER SPIKE VIAL GLASS SM (MISCELLANEOUS) ×3 IMPLANT
DERMABOND ADVANCED (GAUZE/BANDAGES/DRESSINGS) ×2
DERMABOND ADVANCED .7 DNX12 (GAUZE/BANDAGES/DRESSINGS) ×1 IMPLANT
DRESSING SURGICEL FIBRLLR 1X2 (HEMOSTASIS) IMPLANT
DRSG SURGICEL FIBRILLAR 1X2 (HEMOSTASIS)
ELECT REM PT RETURN 9FT ADLT (ELECTROSURGICAL) ×3
ELECTRODE REM PT RTRN 9FT ADLT (ELECTROSURGICAL) ×1 IMPLANT
GLOVE BIO SURGEON STRL SZ7 (GLOVE) ×3 IMPLANT
GLOVE BIOGEL PI IND STRL 7.5 (GLOVE) ×1 IMPLANT
GLOVE BIOGEL PI INDICATOR 7.5 (GLOVE) ×2
GOWN STRL REUS W/ TWL LRG LVL3 (GOWN DISPOSABLE) ×3 IMPLANT
GOWN STRL REUS W/TWL LRG LVL3 (GOWN DISPOSABLE) ×6
GRASPER SUT TROCAR 14GX15 (MISCELLANEOUS) ×3 IMPLANT
IRRIGATION STRYKERFLOW (MISCELLANEOUS) IMPLANT
IRRIGATOR STRYKERFLOW (MISCELLANEOUS)
IV NS 1000ML (IV SOLUTION)
IV NS 1000ML BAXH (IV SOLUTION) IMPLANT
KIT RM TURNOVER STRD PROC AR (KITS) ×3 IMPLANT
NEEDLE HYPO 22GX1.5 SAFETY (NEEDLE) ×3 IMPLANT
NEEDLE INSUFFLATION 14GA 120MM (NEEDLE) ×3 IMPLANT
NS IRRIG 1000ML POUR BTL (IV SOLUTION) ×3 IMPLANT
PACK LAP CHOLECYSTECTOMY (MISCELLANEOUS) ×3 IMPLANT
POUCH ENDO CATCH 10MM SPEC (MISCELLANEOUS) ×3 IMPLANT
SCISSORS METZENBAUM CVD 33 (INSTRUMENTS) ×3 IMPLANT
SLEEVE ENDOPATH XCEL 5M (ENDOMECHANICALS) ×6 IMPLANT
SUT MNCRL AB 4-0 PS2 18 (SUTURE) ×3 IMPLANT
SUT VICRYL 0 UR6 27IN ABS (SUTURE) ×3 IMPLANT
SUT VICRYL AB 3-0 FS1 BRD 27IN (SUTURE) ×3 IMPLANT
SYR 20CC LL (SYRINGE) IMPLANT
TROCAR XCEL NON-BLD 11X100MML (ENDOMECHANICALS) ×3 IMPLANT
TROCAR XCEL NON-BLD 5MMX100MML (ENDOMECHANICALS) ×3 IMPLANT
TUBING INSUFFLATION (TUBING) ×3 IMPLANT

## 2017-06-17 NOTE — Transfer of Care (Signed)
Immediate Anesthesia Transfer of Care Note  Patient: Cody Walker  Procedure(s) Performed: LAPAROSCOPIC CHOLECYSTECTOMY WITH INTRAOPERATIVE CHOLANGIOGRAM (N/A )  Patient Location: PACU  Anesthesia Type:General  Level of Consciousness: sedated  Airway & Oxygen Therapy: Patient Spontanous Breathing  Post-op Assessment: Report given to RN  Post vital signs: Reviewed and stable  Last Vitals:  Vitals:   06/17/17 0743 06/17/17 1336  BP: 122/80 140/76  Pulse: (!) 52 (!) 58  Resp: 16 (!) 28  Temp: (!) 35.9 C (!) 36.1 C  SpO2: 98% 92%    Last Pain:  Vitals:   06/17/17 1336  TempSrc:   PainSc: Asleep         Complications: No apparent anesthesia complications

## 2017-06-17 NOTE — Discharge Instructions (Addendum)
In addition to included general post-operative instructions for Laparoscopic Cholecystectomy,  Diet: Resume home heart healthy  diet.   Activity: No heavy lifting >15-20 pounds (children, pets, laundry, garbage) or strenuous activity until follow-up, but light activity and walking are encouraged. Do not drive or drink alcohol if taking narcotic pain medications.  Wound care: 2 days after surgery (Wednesday, 1/30), may shower/get incision wet with soapy water and pat dry (do not rub incisions), but no baths or submerging incision underwater until follow-up.   Medications: Resume all home medications. For mild to moderate pain: acetaminophen (Tylenol) or ibuprofen/naproxen (if no kidney disease). Combining Tylenol with alcohol can substantially increase your risk of causing liver disease. Narcotic pain medications, if prescribed, can be used for severe pain, though may cause nausea, constipation, and drowsiness. Do not combine Tylenol and Percocet (or similar) within a 6 hour period as Percocet (and similar) contain(s) Tylenol. If you do not need the narcotic pain medication, you do not need to fill the prescription.  Call office [phone]) at any time if any questions, worsening pain, fevers/chills, bleeding, drainage from incision site, or other concerns.    AMBULATORY SURGERY  DISCHARGE INSTRUCTIONS   1) The drugs that you were given will stay in your system until tomorrow so for the next 24 hours you should not:  A) Drive an automobile B) Make any legal decisions C) Drink any alcoholic beverage   2) You may resume regular meals tomorrow.  Today it is better to start with liquids and gradually work up to solid foods.  You may eat anything you prefer, but it is better to start with liquids, then soup and crackers, and gradually work up to solid foods.   3) Please notify your doctor immediately if you have any unusual bleeding, trouble breathing, redness and pain at the surgery site,  drainage, fever, or pain not relieved by medication.    4) Additional Instructions:        Please contact your physician with any problems or Same Day Surgery at [phone], Monday through Friday 6 am to 4 pm, or [place] at [facility] Main number at [phone].

## 2017-06-17 NOTE — Anesthesia Preprocedure Evaluation (Signed)
Anesthesia Evaluation  Patient identified by MRN, date of birth, ID band Patient awake    Reviewed: Allergy & Precautions, H&P , NPO status , Patient's Chart, lab work & pertinent test results  History of Anesthesia Complications (+) PROLONGED EMERGENCE and history of anesthetic complications  Airway Mallampati: III  TM Distance: <3 FB Neck ROM: full    Dental  (+) Chipped, Poor Dentition   Pulmonary neg shortness of breath, asthma ,           Cardiovascular Exercise Tolerance: Good hypertension, (-) angina(-) Past MI and (-) DOE + dysrhythmias      Neuro/Psych negative neurological ROS  negative psych ROS   GI/Hepatic Neg liver ROS, hiatal hernia, GERD  Medicated and Controlled,  Endo/Other  negative endocrine ROS  Renal/GU Renal disease     Musculoskeletal  (+) Arthritis ,   Abdominal   Peds  Hematology negative hematology ROS (+)   Anesthesia Other Findings Past Medical History: 2019: Asthma     Comment:  bronchospasms if near too much smoke.  does not use               inhalers 2017: Barrett's esophagus No date: Bradycardia     Comment:  nothing diagnosed but heart rate runs below 50 usually 2019: Cholelithiasis No date: Complication of anesthesia     Comment:  very disoriented waking up, confused No date: Gastroparesis No date: GERD (gastroesophageal reflux disease)     Comment:  nexium bid No date: History of hiatal hernia     Comment:  multiple small hernias per dr. Markham Jordanelliot No date: History of kidney stones No date: Hypertension  Past Surgical History: 2005: COLONOSCOPY WITH ESOPHAGOGASTRODUODENOSCOPY (EGD)     Comment:  esophageal laceration 1985, 1996: INGUINAL HERNIA REPAIR; Bilateral 1980, 1990: KNEE ARTHROSCOPY; Bilateral 2017: NEUROPLASTY / TRANSPOSITION ULNAR NERVE AT ELBOW; Right     Reproductive/Obstetrics negative OB ROS                              Anesthesia Physical Anesthesia Plan  ASA: III  Anesthesia Plan: General ETT   Post-op Pain Management:    Induction: Intravenous  PONV Risk Score and Plan: 3 and Ondansetron, Dexamethasone, Midazolam and Treatment may vary due to age or medical condition  Airway Management Planned: Oral ETT  Additional Equipment:   Intra-op Plan:   Post-operative Plan: Extubation in OR  Informed Consent: I have reviewed the patients History and Physical, chart, labs and discussed the procedure including the risks, benefits and alternatives for the proposed anesthesia with the patient or authorized representative who has indicated his/her understanding and acceptance.   Dental Advisory Given  Plan Discussed with: Anesthesiologist, CRNA and Surgeon  Anesthesia Plan Comments: (Patient consented for risks of anesthesia including but not limited to:  - adverse reactions to medications - damage to teeth, lips or other oral mucosa - sore throat or hoarseness - Damage to heart, brain, lungs or loss of life  Patient voiced understanding.)        Anesthesia Quick Evaluation

## 2017-06-17 NOTE — Op Note (Signed)
SURGICAL OPERATIVE REPORT   DATE OF PROCEDURE: 06/17/2017  ATTENDING Surgeon(s): Ancil Linseyavis, Jason Evan, MD  ANESTHESIA: GETA  PRE-OPERATIVE DIAGNOSIS: Symptomatic Cholelithiasis (K80.20)  POST-OPERATIVE DIAGNOSIS: Symptomatic Cholelithiasis (K80.20)  PROCEDURE(S): (cpt's: 47562) 1.) Laparoscopic Cholecystectomy  INTRAOPERATIVE FINDINGS: Minimally inflamed gallbladder encased in thick fat at its base and intrahepatic at the apex  INTRAOPERATIVE FLUIDS: 1000 mL crystalloid   ESTIMATED BLOOD LOSS: Minimal (<30 mL)   URINE OUTPUT: No foley  SPECIMENS: Gallbladder  IMPLANTS: None  DRAINS: None   COMPLICATIONS: None apparent   CONDITION AT COMPLETION: Hemodynamically stable and extubated  DISPOSITION: PACU   INDICATION(S) FOR PROCEDURE:  Patient is a 52 y.o. male who recently presented with post-prandial RUQ > epigastric abdominal pain after eating fatty foods in particular. Ultrasound suggested cholelithiasis without sonographic evidence of cholecystitis. All risks, benefits, and alternatives to above elective procedures were discussed with the patient, who elected to proceed, and informed consent was accordingly obtained at that time.   DETAILS OF PROCEDURE:  Patient was brought to the operating suite and appropriately identified. General anesthesia was administered along with peri-operative prophylactic IV antibiotics, and endotracheal intubation was performed by anesthesiologist, along with NG/OG tube for gastric decompression. In supine position, operative site was prepped and draped in usual sterile fashion, and following a brief time out, initial 5 mm incision was made in a natural skin crease just above the umbilicus. Fascia was then elevated, and a Verress needle was inserted and its proper position confirmed using aspiration and saline meniscus test.  Upon insufflation of the abdominal cavity with carbon dioxide to a well-tolerated pressure of 12-15 mmHg, 5 mm  peri-umbilical port followed by laparoscope were inserted and used to inspect the abdominal cavity and its contents with no injuries from insertion of the first trochar noted. Three additional trocars were inserted, one at the epigastric position (10 mm) and two along the Right costal margin (5 mm). The table was then placed in reverse Trendelenburg position with the Right side up. Filmy adhesions between the gallbladder and omentum/duodenum/transverse colon were lysed using combined blunt dissection and selective electrocautery. The apex/dome of the gallbladder was grasped with an atraumatic grasper passed through the lateral port and retracted apically over the liver. The infundibulum was also grasped and retracted, exposing Calot's triangle. The peritoneum overlying the gallbladder infundibulum was incised and dissected free of surrounding peritoneal attachments, revealing the cystic duct and cystic artery, which were clipped twice on the patient side and once on the gallbladder specimen side close to the gallbladder. The gallbladder was then dissected from its peritoneal attachments to the liver using electrocautery. A small opening in the gallbladder was made at its intrahepatic segment at the dome/apex of the gallbladder, for which the gallbladder fossa and over the liver were copiously irrigated and suctioned until clear and dry. The gallbladder was then placed into a laparoscopic specimen bag and removed from the abdominal cavity via the epigastric port site. Hemostasis and secure placement of clips were confirmed, and intra-peritoneal cavity was inspected with no additional findings. PMI laparoscopic fascial closure device was then used to re-approximate fascia at the 10 mm epigastric port site.  All ports were then removed under direct visualization, and abdominal cavity was desuflated. All port sites were irrigated/cleaned, additional local anesthetic was injected at each incision, 3-0 Vicryl was used  to re-approximate dermis at 10 mm port site(s), and subcuticular 4-0 Monocryl suture was used to re-approximate skin. Skin was then cleaned, dried, and sterile skin glue was applied. Patient  was then safely able to be awakened, extubated, and transferred to PACU for post-operative monitoring and care.   I was present for all aspects of procedure, and there were no intra-operative complications apparent.

## 2017-06-17 NOTE — Anesthesia Postprocedure Evaluation (Signed)
Anesthesia Post Note  Patient: Cody Walker  Procedure(s) Performed: LAPAROSCOPIC CHOLECYSTECTOMY WITH INTRAOPERATIVE CHOLANGIOGRAM (N/A )  Patient location during evaluation: PACU Anesthesia Type: General Level of consciousness: awake and alert and oriented Pain management: pain level controlled Vital Signs Assessment: post-procedure vital signs reviewed and stable Respiratory status: spontaneous breathing Cardiovascular status: blood pressure returned to baseline Anesthetic complications: no     Last Vitals:  Vitals:   06/17/17 1451 06/17/17 1504  BP:  (!) 150/85  Pulse:    Resp:  18  Temp:  37.1 C  SpO2: 94% 94%    Last Pain:  Vitals:   06/17/17 1504  TempSrc:   PainSc: Asleep                 Sevanna Ballengee

## 2017-06-17 NOTE — Anesthesia Post-op Follow-up Note (Signed)
Anesthesia QCDR form completed.        

## 2017-06-17 NOTE — Anesthesia Procedure Notes (Signed)
Procedure Name: Intubation Performed by: Hedda Slade, CRNA Pre-anesthesia Checklist: Patient identified, Patient being monitored, Timeout performed, Emergency Drugs available and Suction available Patient Re-evaluated:Patient Re-evaluated prior to induction Oxygen Delivery Method: Circle system utilized Preoxygenation: Pre-oxygenation with 100% oxygen Induction Type: IV induction Ventilation: Mask ventilation without difficulty Laryngoscope Size: Mac and 4 Grade View: Grade I Tube type: Oral Tube size: 7.5 mm Number of attempts: 1 Airway Equipment and Method: Stylet Placement Confirmation: ETT inserted through vocal cords under direct vision,  positive ETCO2 and breath sounds checked- equal and bilateral Secured at: 22 cm Tube secured with: Tape Dental Injury: Teeth and Oropharynx as per pre-operative assessment

## 2017-06-17 NOTE — Interval H&P Note (Signed)
History and Physical Interval Note:  06/17/2017 10:59 AM  Cody Walker  has presented today for surgery, with the diagnosis of GALLBLADDER CALCULUS  The various methods of treatment have been discussed with the patient and family. After consideration of risks, benefits and other options for treatment, the patient has consented to  Procedure(s): LAPAROSCOPIC CHOLECYSTECTOMY WITH INTRAOPERATIVE CHOLANGIOGRAM (N/A) as a surgical intervention .  The patient's history has been reviewed, patient examined, no change in status, stable for surgery.  I have reviewed the patient's chart and labs.  Questions were answered to the patient's satisfaction.     Cody Walker

## 2017-06-18 ENCOUNTER — Encounter: Payer: Self-pay | Admitting: Surgery

## 2017-06-18 LAB — SURGICAL PATHOLOGY

## 2017-06-24 ENCOUNTER — Telehealth: Payer: Self-pay | Admitting: Surgery

## 2017-06-24 NOTE — Telephone Encounter (Signed)
Call returned to patient at this time. He advised me that he having some soreness but is overall feeling good. He denies nausea, vomiting, fever/chills, and constipation/diahhrea. 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 I advise him that he is to do no heavy lifting of any kind until we see him back in office 2/14 with Dr. [name]. He verbalized understanding and stated that he will have his sister in law will pick up the letter. I verbalized understanding.

## 2017-06-24 NOTE — Telephone Encounter (Signed)
Patients calling asking for a doctors note to return to work. Please call patient and advise. Patient can be reached at [phone].

## 2017-07-03 DIAGNOSIS — K8020 Calculus of gallbladder without cholecystitis without obstruction: Secondary | ICD-10-CM

## 2017-07-04 ENCOUNTER — Encounter: Payer: Self-pay | Admitting: Surgery

## 2017-07-04 ENCOUNTER — Ambulatory Visit (INDEPENDENT_AMBULATORY_CARE_PROVIDER_SITE_OTHER): Payer: BLUE CROSS/BLUE SHIELD | Admitting: Surgery

## 2017-07-04 VITALS — BP 161/99 | HR 61 | Temp 97.8°F | Ht 68.0 in | Wt 191.0 lb

## 2017-07-04 DIAGNOSIS — K8021 Calculus of gallbladder without cholecystitis with obstruction: Secondary | ICD-10-CM

## 2017-07-04 NOTE — Patient Instructions (Signed)
GENERAL POST-OPERATIVE PATIENT INSTRUCTIONS   WOUND CARE INSTRUCTIONS:  Keep a dry clean dressing on the wound if there is drainage. The initial bandage may be removed after 24 hours.  Once the wound has quit draining you may leave it open to air.  If clothing rubs against the wound or causes irritation and the wound is not draining you may cover it with a dry dressing during the daytime.  Try to keep the wound dry and avoid ointments on the wound unless directed to do so.  If the wound becomes bright red and painful or starts to drain infected material that is not clear, please contact your physician immediately.  If the wound is mildly pink and has a thick firm ridge underneath it, this is normal, and is referred to as a healing ridge.  This will resolve over the next 4-6 weeks.  BATHING: You may shower if you have been informed of this by your surgeon. However, Please do not submerge in a tub, hot tub, or pool until incisions are completely sealed or have been told by your surgeon that you may do so.  DIET:  You may eat any foods that you can tolerate.  It is a good idea to eat a high fiber diet and take in plenty of fluids to prevent constipation.  If you do become constipated you may want to take a mild laxative or take ducolax tablets on a daily basis until your bowel habits are regular.  Constipation can be very uncomfortable, along with straining, after recent surgery.  ACTIVITY:  You are encouraged to cough and deep breath or use your incentive spirometer if you were given one, every 15-30 minutes when awake.  This will help prevent respiratory complications and low grade fevers post-operatively if you had a general anesthetic.  You may want to hug a pillow when coughing and sneezing to add additional support to the surgical area, if you had abdominal or chest surgery, which will decrease pain during these times.  You are encouraged to walk and engage in light activity for the next two weeks.  You  should not lift more than 20 pounds, until 07/29/2017 as it could put you at increased risk for complications.  Twenty pounds is roughly equivalent to a plastic bag of groceries. At that time- Listen to your body when lifting, if you have pain when lifting, stop and then try again in a few days. Soreness after doing exercises or activities of daily living is normal as you get back in to your normal routine.  MEDICATIONS:  Try to take narcotic medications and anti-inflammatory medications, such as tylenol, ibuprofen, naprosyn, etc., with food.  This will minimize stomach upset from the medication.  Should you develop nausea and vomiting from the pain medication, or develop a rash, please discontinue the medication and contact your physician.  You should not drive, make important decisions, or operate machinery when taking narcotic pain medication.  SUNBLOCK Use sun block to incision area over the next year if this area will be exposed to sun. This helps decrease scarring and will allow you avoid a permanent darkened area over your incision.  QUESTIONS:  Please feel free to call our office if you have any questions, and we will be glad to assist you. [phone]    

## 2017-07-04 NOTE — Progress Notes (Signed)
Surgical Clinic Progress/Follow-up Note   HPI:  52 y.o. Male presents to clinic for post-op follow-up evaluation s/p laparoscopic cholecystectomy for symptomatic cholelithiasis. Patient reports complete resolution of pre-operative post-prandial RUQ abdominal pain and has been tolerating regular diet with +flatus and normal BM's following a brief period of constipation, likely associated with post-surgical narcotics for the first 2-3 days, denies N/V, fever/chills, CP, or SOB.  Review of Systems:  Constitutional: denies any other weight loss, fever, chills, or sweats  Eyes: denies any other vision changes, history of eye injury  ENT: denies sore throat, hearing problems  Respiratory: denies shortness of breath, wheezing  Cardiovascular: denies chest pain, palpitations  Gastrointestinal: abdominal pain, N/V, and bowel function as per HPI Musculoskeletal: denies any other joint pains or cramps  Skin: Denies any other rashes or skin discolorations  Neurological: denies any other headache, dizziness, weakness  Psychiatric: denies any other depression, anxiety  All other review of systems: otherwise negative   Vital Signs:  BP (!) 161/99   Pulse 61   Temp 97.8 F (36.6 C) (Oral)   Ht 5\' 8"  (1.727 m)   Wt 191 lb (86.6 kg)   BMI 29.04 kg/m    Physical Exam:  Constitutional:  -- Normal body habitus  -- Awake, alert, and oriented x3  Eyes:  -- Pupils equally round and reactive to light  -- No scleral icterus  Ear, nose, throat:  -- No jugular venous distension  -- No nasal drainage, bleeding Pulmonary:  -- No crackles -- Equal breath sounds bilaterally -- Breathing non-labored at rest Cardiovascular:  -- S1, S2 present  -- No pericardial rubs  Gastrointestinal:  -- Soft, nontender, non-distended, no guarding/rebound -- Incisions well-approximated without surrounding erythema or drainage -- No abdominal masses appreciated, pulsatile or otherwise  Musculoskeletal /  Integumentary:  -- Wounds or skin discoloration: None appreciated except post-surgical wounds as described above (GI) -- Extremities: B/L UE and LE FROM, hands and feet warm, no edema  Neurologic:  -- Motor function: intact and symmetric  -- Sensation: intact and symmetric   Assessment:  52 y.o. yo Male with a problem list including...  Patient Active Problem List   Diagnosis Date Noted  . Calculus of gallbladder without cholecystitis without obstruction   . Gallbladder calculus without cholecystitis and with obstruction   . Arthritis 06/06/2017  . Chronic low back pain 06/06/2017  . Gastric reflux 06/06/2017  . Hyperlipemia 06/06/2017  . Kidney stones 06/06/2017  . 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  -- Scherrie GerlachJason E. [name], MD, RPVI [place]: [facility] - Partnering for exceptional care. Office: [phone]

## 2018-05-06 IMAGING — CT CT CTA ABD/PEL W/CM AND/OR W/O CM
2 of 9 series · 8 of 46 positions shown, 14 images · IV contrast (iopamidol)
Comparison: CT abdomen pelvis - 11/27/2011

CLINICAL DATA: Severe abdominal pain. Evaluate for mesenteric
ischemia.

EXAM:
CT ANGIOGRAPHY ABDOMEN AND PELVIS WITH CONTRAST
TECHNIQUE: Multidetector CT imaging of the abdomen and pelvis was performed
using the standard protocol during bolus administration of
intravenous contrast. Multiplanar reconstructed images and MIPs were
obtained and reviewed to evaluate the vascular anatomy.
CONTRAST:  100mL 33IQ3Y-EM1 IOPAMIDOL (33IQ3Y-EM1) INJECTION 76%

[Series 7: axial venous · axial · portal-venous · 0.73mm/px · z∈[-799,-434]mm · 6 of 103 slices shown, 11 images]
[im 15/103  soft-tissue]
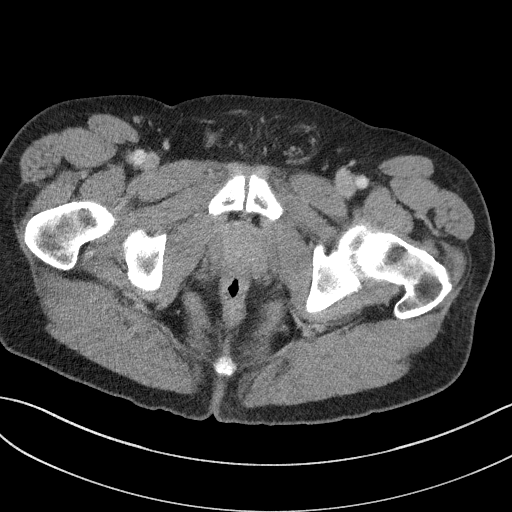
[im 15/103  bone]
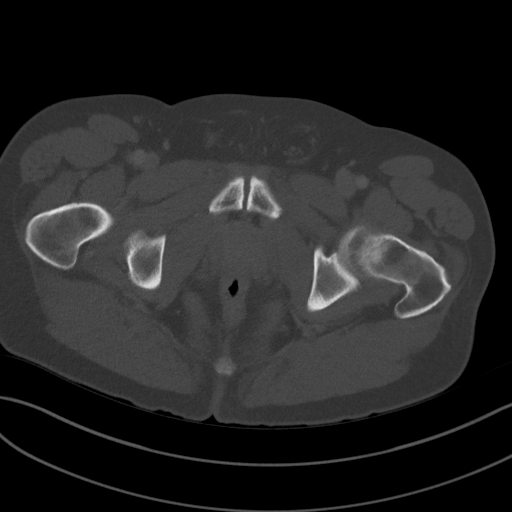
[im 30/103  soft-tissue]
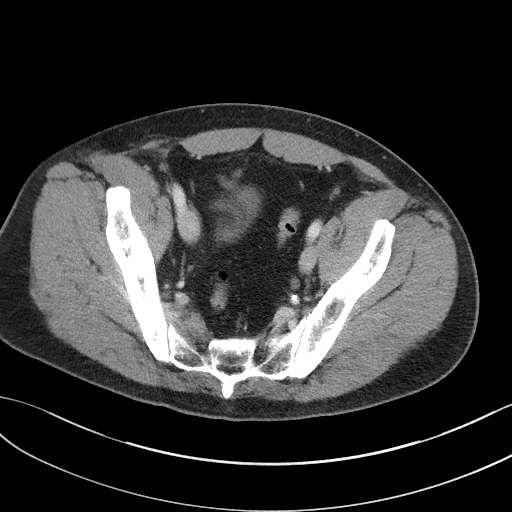
[im 44/103  soft-tissue]
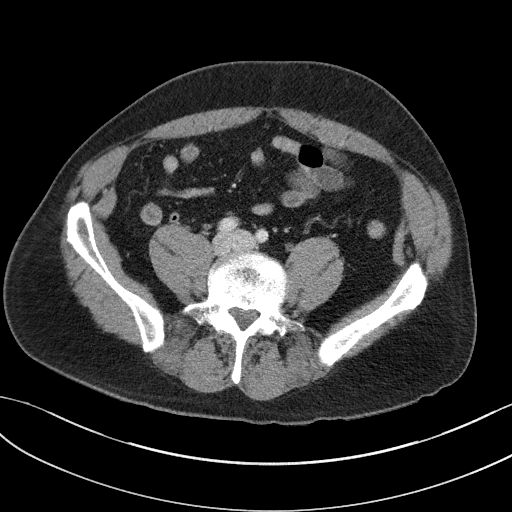
[im 44/103  lung]
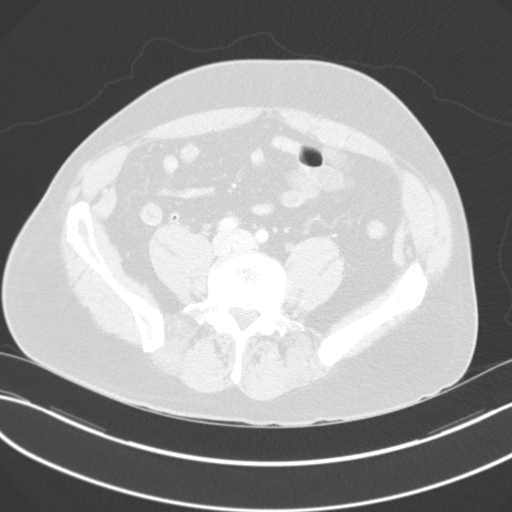
[im 59/103  soft-tissue]
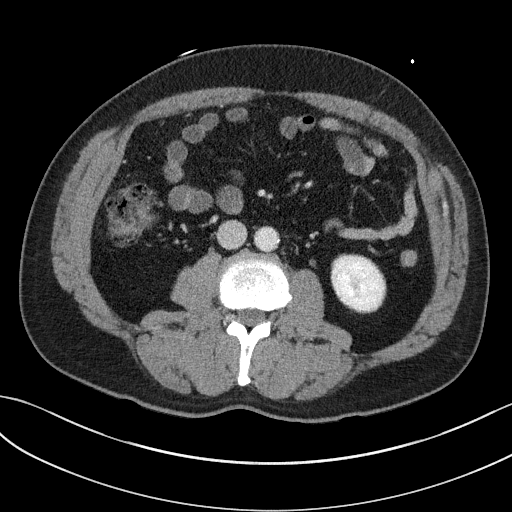
[im 59/103  lung]
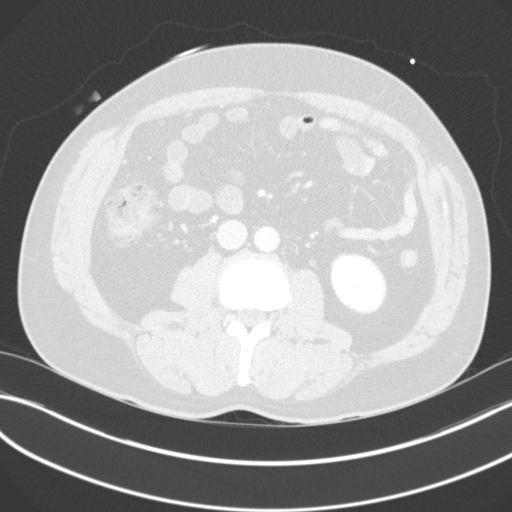
[im 73/103  soft-tissue]
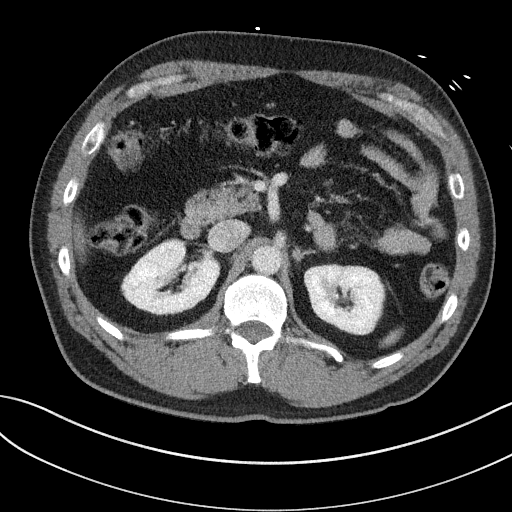
[im 73/103  lung]
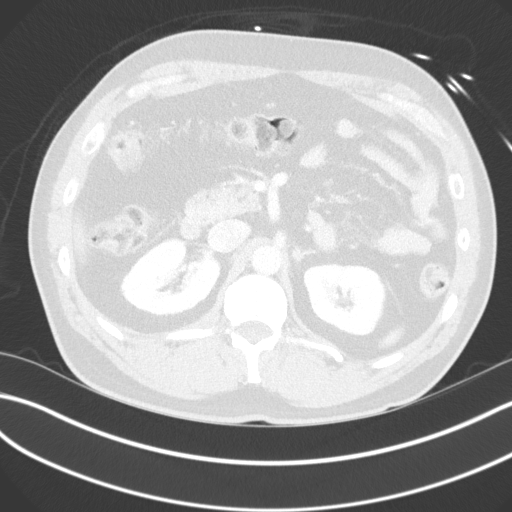
[im 88/103  soft-tissue]
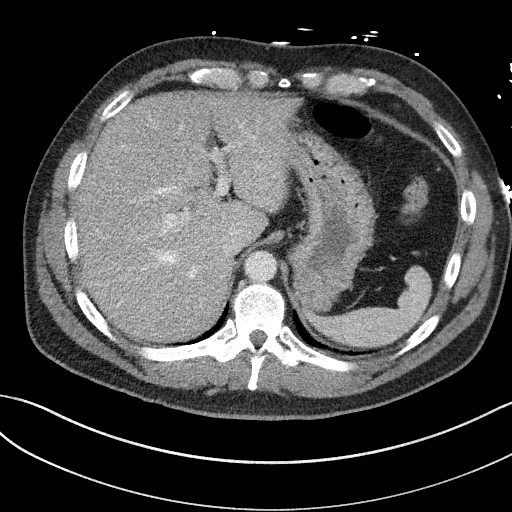
[im 88/103  lung]
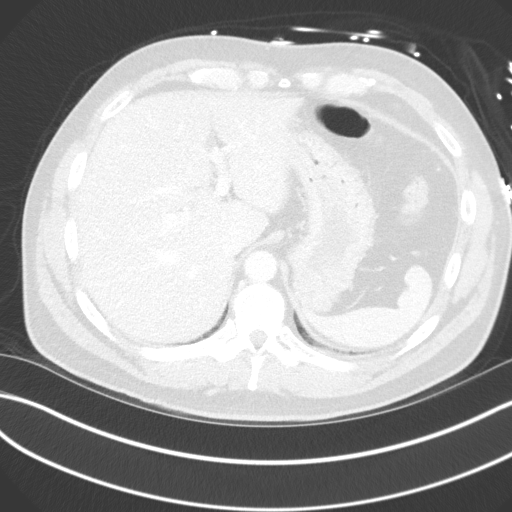

[Series 13: coronal venous mpr · coronal · portal-venous · 0.79mm/px · 2 of 123 slices shown, 3 images]
[im 41/123  soft-tissue]
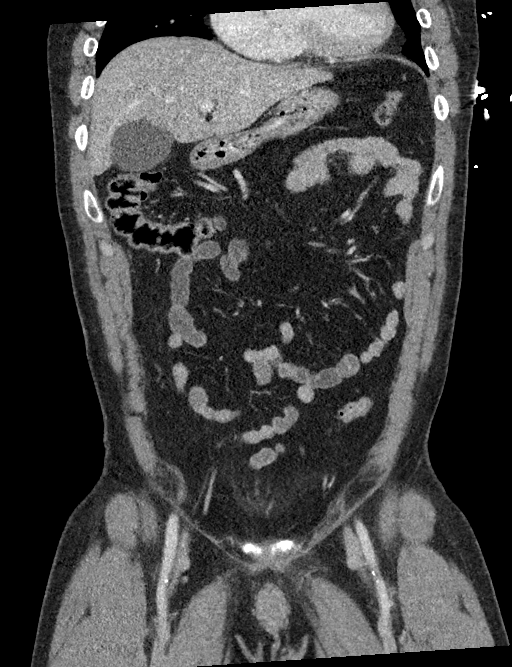
[im 41/123  bone]
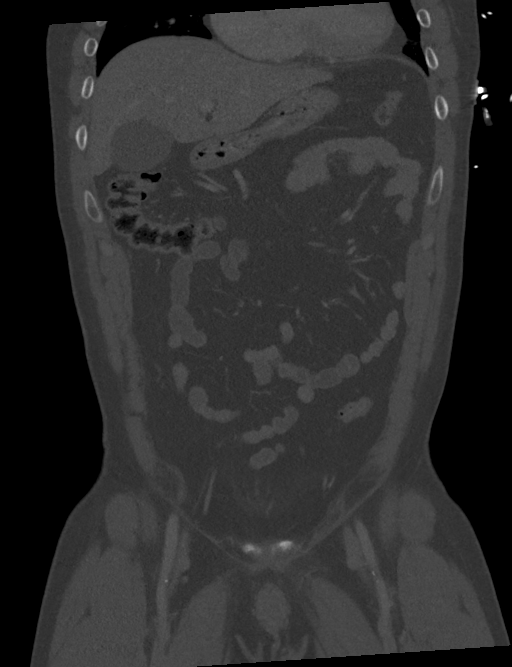
[im 82/123  soft-tissue]
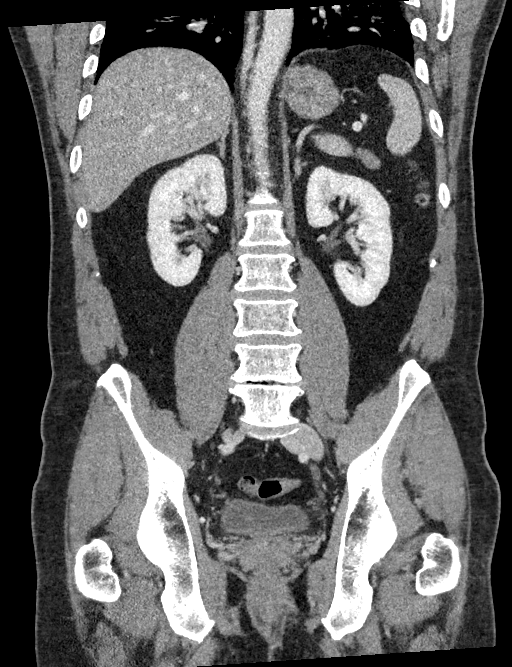

[8 of 46 positions shown; findings below may reference images not displayed]

FINDINGS: VASCULAR

Aorta: Minimal amount of mixed calcified and noncalcified
atherosclerotic plaque with a normal caliber abdominal aorta, not
resulting in hemodynamically significant stenosis. No abdominal
aortic dissection or periaortic stranding.

Celiac: Widely patent without hemodynamically significant stenosis.
Conventional branching pattern.

SMA: Widely patent without hemodynamically significant stenosis.
Conventional branching pattern. The distal tributaries of the SMA
are widely patent without discrete intraluminal filling defect
suggest distal embolism.

Renals: Duplicated bilaterally with bilateral accessory renal artery
supplying the inferior poles of the bilateral kidneys. The bilateral
dominant renal arteries are widely patent without hemodynamically
significant narrowing. No vessel irregularity to suggest FMD.

IMA: Widely patent without hemodynamically significant narrowing.

Inflow: There is a very minimal amount of mixed calcified and
noncalcified atherosclerotic plaque involving the left common iliac
artery (image 142, series 4), not resulting in hemodynamically
significant stenosis. The bilateral internal iliac arteries are
mildly disease though patent and of normal caliber. The bilateral
external iliac arteries are of normal caliber and widely patent
without hemodynamically significant stenosis.

Proximal Outflow: The bilateral common, superficial and deep femoral
arteries are of normal caliber and widely patent without
hemodynamically significant stenosis. There is a minimal amount of
mixed calcified and noncalcified atherosclerotic plaque involving
the proximal aspects of the bilateral superficial femoral arteries
(images 223 and 229, series 4), not resulting in a hemodynamically
significant stenosis.

Veins: The IVC and pelvic venous system appears widely patent.

Review of the MIP images confirms the above findings.

NON-VASCULAR

Lower chest: Limited visualization of the lower thorax demonstrates
ground-glass atelectasis within the imaged bilateral lower lobes. No
discrete focal airspace opacities. No pleural effusion.

Borderline cardiomegaly.  No pericardial effusion.

Hepatobiliary: Normal hepatic contour. No discrete hepatic lesions.
No ascites.

There are 2 stones lodged within the neck of the gallbladder with
dominant stone measuring 0.6 cm in diameter (coronal image 61,
series 6). This finding is associated with distention of the
gallbladder suspected minimal amount of pericholecystic fluid. No
definitive gallbladder wall thickening. No intra or extrahepatic
biliary ductal dilatation.

Pancreas: Normal appearance of the pancreas

Spleen: Normal appearance of the spleen

Adrenals/Urinary Tract: There is symmetric enhancement of the
bilateral kidneys. No definite renal stones on this postcontrast
examination. No discrete renal lesions. No urine obstruction or
perinephric stranding.

Normal appearance the bilateral adrenal glands.

Normal appearance of the urinary bladder given degree distention.

Stomach/Bowel: Suspected small hiatal hernia. Moderate colonic stool
burden without evidence of enteric obstruction. Normal appearance of
the terminal ileum and retrocecal appendix. No pneumoperitoneum,
pneumatosis or portal venous gas.

Lymphatic: No bulky retroperitoneal, mesenteric, pelvic or inguinal
lymphadenopathy.

Reproductive: Borderline enlarged prostate gland. No free fluid the
pelvic cul-de-sac.

Other: Small mesenteric fat containing left-sided inguinal hernia.
Tiny mesenteric fat containing periumbilical hernia.

Musculoskeletal: No acute or aggressive osseous abnormalities.
Mild-to-moderate multilevel lumbar spine DDD, worse at L4-L5 and
L5-S1 with disc space height loss, endplate irregularity and
sclerosis.
IMPRESSION: VASCULAR

1. Minimal amount of atherosclerotic plaque within a normal caliber
abdominal aorta. Aortic Atherosclerosis ([id number]).
2. The major branch vessels of the abdominal aorta appear widely
patent. No evidence of mesenteric ischemia.

NON-VASCULAR

1. The gallbladder is distended with 2 stones located within the
neck of the gallbladder. This finding is without associated
gallbladder wall thickening and suspected minimal amount of adjacent
pericholecystic fluid but without definitive gallbladder wall
thickening. Constellation of findings are worrisome for early acute
cholecystitis. Clinical correlation is advised. Further evaluation
with right upper quadrant abdominal ultrasound and/or nuclear
medicine HIDA scan could be performed as indicated.
2. Otherwise, no explanation for patient's acute abdominal pain.
